# Patient Record
Sex: Male | Born: 1935 | Race: White | Hispanic: No | Marital: Married | Smoking: Former smoker
Health system: Southern US, Community
[De-identification: ages and names within clinical notes are randomized; demographics above are authoritative.]

## PROBLEM LIST (undated history)

## (undated) DIAGNOSIS — J8410 Pulmonary fibrosis, unspecified: Secondary | ICD-10-CM

## (undated) DIAGNOSIS — C801 Malignant (primary) neoplasm, unspecified: Secondary | ICD-10-CM

## (undated) DIAGNOSIS — C4490 Unspecified malignant neoplasm of skin, unspecified: Secondary | ICD-10-CM

## (undated) DIAGNOSIS — D499 Neoplasm of unspecified behavior of unspecified site: Secondary | ICD-10-CM

## (undated) DIAGNOSIS — I10 Essential (primary) hypertension: Secondary | ICD-10-CM

## (undated) DIAGNOSIS — H9113 Presbycusis, bilateral: Secondary | ICD-10-CM

## (undated) DIAGNOSIS — E119 Type 2 diabetes mellitus without complications: Secondary | ICD-10-CM

## (undated) DIAGNOSIS — M1990 Unspecified osteoarthritis, unspecified site: Secondary | ICD-10-CM

## (undated) HISTORY — DX: Essential (primary) hypertension: I10

## (undated) HISTORY — DX: Type 2 diabetes mellitus without complications: E11.9

## (undated) HISTORY — DX: Unspecified malignant neoplasm of skin, unspecified: C44.90

## (undated) HISTORY — DX: Pulmonary fibrosis, unspecified: J84.10

## (undated) HISTORY — DX: Unspecified osteoarthritis, unspecified site: M19.90

## (undated) HISTORY — DX: Malignant (primary) neoplasm, unspecified: C80.1

## (undated) HISTORY — PX: APPENDECTOMY: SHX54

## (undated) HISTORY — DX: Presbycusis, bilateral: H91.13

## (undated) HISTORY — PX: NECK SURGERY: SHX720

---

## 1984-06-25 HISTORY — PX: ROTATOR CUFF REPAIR: SHX139

## 2000-01-29 ENCOUNTER — Encounter: Admission: RE | Admit: 2000-01-29 | Discharge: 2000-01-29 | Payer: Self-pay

## 2000-03-25 ENCOUNTER — Encounter: Admission: RE | Admit: 2000-03-25 | Discharge: 2000-04-11 | Payer: Self-pay | Admitting: Orthopedic Surgery

## 2001-10-03 ENCOUNTER — Encounter (INDEPENDENT_AMBULATORY_CARE_PROVIDER_SITE_OTHER): Payer: Self-pay | Admitting: Specialist

## 2001-10-03 ENCOUNTER — Ambulatory Visit (HOSPITAL_COMMUNITY): Admission: RE | Admit: 2001-10-03 | Discharge: 2001-10-03 | Payer: Self-pay | Admitting: *Deleted

## 2002-12-25 ENCOUNTER — Ambulatory Visit (HOSPITAL_COMMUNITY): Admission: RE | Admit: 2002-12-25 | Discharge: 2002-12-25 | Payer: Self-pay | Admitting: *Deleted

## 2002-12-25 ENCOUNTER — Encounter (INDEPENDENT_AMBULATORY_CARE_PROVIDER_SITE_OTHER): Payer: Self-pay | Admitting: Specialist

## 2004-01-28 ENCOUNTER — Encounter: Admission: RE | Admit: 2004-01-28 | Discharge: 2004-01-28 | Payer: Self-pay | Admitting: Internal Medicine

## 2004-05-10 ENCOUNTER — Encounter (INDEPENDENT_AMBULATORY_CARE_PROVIDER_SITE_OTHER): Payer: Self-pay | Admitting: Specialist

## 2004-05-10 ENCOUNTER — Ambulatory Visit (HOSPITAL_COMMUNITY): Admission: RE | Admit: 2004-05-10 | Discharge: 2004-05-10 | Payer: Self-pay | Admitting: *Deleted

## 2006-07-31 ENCOUNTER — Encounter (INDEPENDENT_AMBULATORY_CARE_PROVIDER_SITE_OTHER): Payer: Self-pay | Admitting: Specialist

## 2006-07-31 ENCOUNTER — Ambulatory Visit (HOSPITAL_COMMUNITY): Admission: RE | Admit: 2006-07-31 | Discharge: 2006-07-31 | Payer: Self-pay | Admitting: *Deleted

## 2008-12-20 ENCOUNTER — Encounter: Admission: RE | Admit: 2008-12-20 | Discharge: 2008-12-20 | Payer: Self-pay | Admitting: Endocrinology

## 2009-10-24 ENCOUNTER — Encounter: Payer: Self-pay | Admitting: Pulmonary Disease

## 2009-10-27 ENCOUNTER — Encounter: Payer: Self-pay | Admitting: Pulmonary Disease

## 2009-10-28 ENCOUNTER — Encounter: Admission: RE | Admit: 2009-10-28 | Discharge: 2009-10-28 | Payer: Self-pay | Admitting: Internal Medicine

## 2009-10-28 ENCOUNTER — Encounter: Payer: Self-pay | Admitting: Pulmonary Disease

## 2009-11-08 ENCOUNTER — Ambulatory Visit: Payer: Self-pay | Admitting: Pulmonary Disease

## 2009-11-08 DIAGNOSIS — I10 Essential (primary) hypertension: Secondary | ICD-10-CM | POA: Insufficient documentation

## 2009-11-08 DIAGNOSIS — J8410 Pulmonary fibrosis, unspecified: Secondary | ICD-10-CM

## 2009-11-08 LAB — CONVERTED CEMR LAB
Angiotensin 1 Converting Enzyme: 32 units/L (ref 9–67)
Anti Nuclear Antibody(ANA): NEGATIVE

## 2009-11-09 LAB — CONVERTED CEMR LAB
ALT: 27 units/L (ref 0–53)
AST: 31 units/L (ref 0–37)
Albumin: 4.3 g/dL (ref 3.5–5.2)
Alkaline Phosphatase: 73 units/L (ref 39–117)
BUN: 17 mg/dL (ref 6–23)
Basophils Absolute: 0.1 10*3/uL (ref 0.0–0.1)
Basophils Relative: 0.7 % (ref 0.0–3.0)
Bilirubin, Direct: 0.1 mg/dL (ref 0.0–0.3)
CO2: 32 meq/L (ref 19–32)
Calcium: 10 mg/dL (ref 8.4–10.5)
Chloride: 99 meq/L (ref 96–112)
Creatinine, Ser: 1 mg/dL (ref 0.4–1.5)
Eosinophils Absolute: 0.3 10*3/uL (ref 0.0–0.7)
Eosinophils Relative: 3.2 % (ref 0.0–5.0)
GFR calc non Af Amer: 76.96 mL/min (ref >60)
Glucose, Bld: 128 mg/dL — ABNORMAL HIGH (ref 70–99)
HCT: 45.8 % (ref 39.0–52.0)
Hemoglobin: 15.9 g/dL (ref 13.0–17.0)
Lymphocytes Relative: 32.9 % (ref 12.0–46.0)
Lymphs Abs: 2.8 10*3/uL (ref 0.7–4.0)
MCHC: 34.6 g/dL (ref 30.0–36.0)
MCV: 92.3 fL (ref 78.0–100.0)
Monocytes Absolute: 0.8 10*3/uL (ref 0.1–1.0)
Monocytes Relative: 9.6 % (ref 3.0–12.0)
Neutro Abs: 4.6 10*3/uL (ref 1.4–7.7)
Neutrophils Relative %: 53.6 % (ref 43.0–77.0)
Platelets: 234 10*3/uL (ref 150.0–400.0)
Potassium: 4.4 meq/L (ref 3.5–5.1)
RBC: 4.97 M/uL (ref 4.22–5.81)
RDW: 12.6 % (ref 11.5–14.6)
Rheumatoid fact SerPl-aCnc: 22.7 intl units/mL — ABNORMAL HIGH (ref 0.0–20.0)
Sodium: 140 meq/L (ref 135–145)
Total Bilirubin: 1 mg/dL (ref 0.3–1.2)
Total Protein: 7.3 g/dL (ref 6.0–8.3)
WBC: 8.5 10*3/uL (ref 4.5–10.5)

## 2009-11-10 ENCOUNTER — Ambulatory Visit: Payer: Self-pay | Admitting: Internal Medicine

## 2009-12-01 ENCOUNTER — Ambulatory Visit: Payer: Self-pay | Admitting: Pulmonary Disease

## 2009-12-01 ENCOUNTER — Encounter: Payer: Self-pay | Admitting: Pulmonary Disease

## 2009-12-01 DIAGNOSIS — R059 Cough, unspecified: Secondary | ICD-10-CM | POA: Insufficient documentation

## 2009-12-01 DIAGNOSIS — R05 Cough: Secondary | ICD-10-CM | POA: Insufficient documentation

## 2010-03-20 DIAGNOSIS — K219 Gastro-esophageal reflux disease without esophagitis: Secondary | ICD-10-CM

## 2010-04-04 ENCOUNTER — Ambulatory Visit: Payer: Self-pay | Admitting: Pulmonary Disease

## 2010-07-27 NOTE — Miscellaneous (Signed)
Summary: Orders Update pft charges  Clinical Lists Changes  Orders: Added new Service order of Carbon Monoxide diffusing w/capacity (94720) - Signed Added new Service order of Lung Volumes (94240) - Signed Added new Service order of Spirometry (Pre & Post) (94060) - Signed 

## 2010-07-27 NOTE — Assessment & Plan Note (Signed)
Summary: f/u ///kp   Visit Type:  Follow-up Primary Provider/Referring Provider:  Dr. Merri Brunette  CC:  Pulmonary fibrosis follow-up...the patient has no breathing complaints today.  History of Present Illness: 77 yo male with pulmonary fibrosis.  He has no complaints.  He is not having cough, wheeze, sputum, chest pain, fever, hemoptysis, gland swelling, rash, or leg swelling.    He has been getting ringing in his ears, but was told there is nothing he can do for this.  He is sleeping okay, and has not noticed any problem with his breathing while asleep.  He has been active in his garden, and does not feel like his breathing limits his activity in any way.  He got his flu shot earlier this month.  Current Medications (verified): 1)  Atenolol-Chlorthalidone 50-25 Mg Tabs (Atenolol-Chlorthalidone) .Marland Kitchen.. 1 By Mouth Daily 2)  Aspirin 81 Mg Tabs (Aspirin) .Marland Kitchen.. 1 By Mouth Daily 3)  Fish Oil 1000 Mg Caps (Omega-3 Fatty Acids) .Marland Kitchen.. 1 By Mouth At Bedtime 4)  Prilosec 20 Mg Cpdr (Omeprazole) .Marland Kitchen.. 1 By Mouth Daily 5)  Cinnamon 500 Mg Tabs (Cinnamon) .... 2 By Mouth Daily 6)  Centrum Silver  Tabs (Multiple Vitamins-Minerals) .Marland Kitchen.. 1 By Mouth Daily 7)  Allergy Relief 4 Mg Tabs (Chlorpheniramine Maleate) .Marland Kitchen.. 1 By Mouth Daily As Needed  Allergies (verified): 1)  ! * Keflex 2)  ! * Vioxx 3)  ! * Codeine 4)  ! * Mycins  Past History:  Past Medical History: Hypertension Hyperlipidemia GERD Right renal angiomyolipoma Pulmonary fibrosis      - PFT 12/01/09 FEV1 2.64(114%), FEV1% 84, TLC 5.22(93%), DLCO 89%, no BD Chronic cough       - likely postnasal drip and reflux  Past Surgical History: Reviewed history from 11/08/2009 and no changes required. Appendectomy Right rotator cuff Bone spur right foot  Vital Signs:  Patient profile:   77 year old male Height:      67 inches (170.18 cm) Weight:      161 pounds (73.18 kg) BMI:     25.31 O2 Sat:      99 % on Room air Temp:     97.8  degrees F (36.56 degrees C) oral Pulse rate:   53 / minute BP sitting:   132 / 82  (left arm) Cuff size:   regular  Vitals Entered By: Michel Bickers CMA (April 04, 2010 1:46 PM)  O2 Sat at Rest %:  99 O2 Flow:  Room air CC: Pulmonary fibrosis follow-up...the patient has no breathing complaints today Comments Medications reviewed with patient Michel Bickers [facility]  April 04, 2010 1:54 PM   Physical Exam  General:  normal appearance and healthy appearing.   Nose:  no deformity, discharge, inflammation, or lesions Mouth:  no deformity or lesions Neck:  no JVD.   Lungs:  faint basilar rales, no wheeze Heart:  regular rate and rhythm, S1, S2 without murmurs, rubs, gallops, or clicks Extremities:  no clubbing, cyanosis, edema, or deformity noted Neurologic:  normal CN II-XII and strength normal.   Cervical Nodes:  no significant adenopathy Psych:  alert and cooperative; normal mood and affect; normal attention span and concentration   Impression & Recommendations:  Problem # 1:  PULMONARY FIBROSIS (ICD-515) Stable.  Will monitor clinically.  Will consider repeat pulmonary function testing and/or chest imaging if his symptoms progress.  Problem # 2:  COUGH (ICD-786.2)  Improved.  He is to continue as needed nasal irrigation.  Problem # 3:  GERD (ICD-530.81)  He was previously seen by Dr. Virginia Rochester, and was told he had Barrett's.  He was recently evaluated by Dr. Elnoria Howard, and was informed that he may never have had Barrett's  Complete Medication List: 1)  Atenolol-chlorthalidone 50-25 Mg Tabs (Atenolol-chlorthalidone) .Marland Kitchen.. 1 by mouth daily 2)  Aspirin 81 Mg Tabs (Aspirin) .Marland Kitchen.. 1 by mouth daily 3)  Fish Oil 1000 Mg Caps (Omega-3 fatty acids) .Marland Kitchen.. 1 by mouth at bedtime 4)  Prilosec 20 Mg Cpdr (Omeprazole) .Marland Kitchen.. 1 by mouth daily 5)  Cinnamon 500 Mg Tabs (Cinnamon) .... 2 by mouth daily 6)  Centrum Silver Tabs (Multiple vitamins-minerals) .Marland Kitchen.. 1 by mouth daily 7)  Allergy Relief 4 Mg Tabs  (Chlorpheniramine maleate) .Marland Kitchen.. 1 by mouth daily as needed  Other Orders: Est. Patient Level III (16109)  Patient Instructions: 1)  Follow up in 6 months   Immunization History:  Influenza Immunization History:    Influenza:  historical (03/30/2010)

## 2010-07-27 NOTE — Assessment & Plan Note (Signed)
Summary: f/u after PFT/LC   Visit Type:  Follow-up Primary Provider/Referring Provider:  Dr. Merri Brunette  CC:  Pulmonary fibrosis follow-up.  The patient has no complaints today.Marland Kitchen  History of Present Illness: 77 yo male with pulmonary fibrosis.  He has occasional dry cough.  He has some reflux and postnasal drip.  He gets a globus sensation.  He is not having wheezing.  He has noticed that his reflux is worse after taking fish oil.  Labs from May 17 were reviewed, and unremarkable.  Pulmonary function test 12/01/09: Normal spirometry, no bronchodilator response, normal lung volumes, normal diffusion.  CT of Chest  Procedure date:  11/10/2009  Findings:      Comparison: None.   Findings: There are no pathologically enlarged lymph nodes.  Faint calcific density is noted in the left coronary artery.  Hiatal hernia.   The subpleural interstitial markings are rather diffusely accentuated but are mainly prominent in the lower lung zones/bases where there is a small cystic foci compatible with subpleural honeycombing.  No pulmonary mass lesions.  Mild traction bronchiectasis in the medial aspects of the lower lung zones. Main pulmonary trunk measures 2.9 cm in diameter which is at the upper limits of normal.   IMPRESSION: Pulmonary interstitial fibrosis.   Current Medications (verified): 1)  Atenolol-Chlorthalidone 50-25 Mg Tabs (Atenolol-Chlorthalidone) .Marland Kitchen.. 1 By Mouth Daily 2)  Aspirin 81 Mg Tabs (Aspirin) .Marland Kitchen.. 1 By Mouth Daily 3)  Fish Oil 1000 Mg Caps (Omega-3 Fatty Acids) .Marland Kitchen.. 1 By Mouth At Bedtime 4)  Prilosec 20 Mg Cpdr (Omeprazole) .Marland Kitchen.. 1 By Mouth Daily 5)  Cinnamon 500 Mg Tabs (Cinnamon) .... 2 By Mouth Daily 6)  Centrum Silver  Tabs (Multiple Vitamins-Minerals) .Marland Kitchen.. 1 By Mouth Daily 7)  Allergy Relief 4 Mg Tabs (Chlorpheniramine Maleate) .Marland Kitchen.. 1 By Mouth Daily As Needed  Allergies (verified): 1)  ! * Keflex 2)  ! * Vioxx 3)  ! * Codeine 4)  ! * Mycins  Past  History:  Past Medical History: Hypertension Hyperlipidemia Barrett's Esophagus Right renal angiomyolipoma Pulmonary fibrosis      - PFT 12/01/09 FEV1 2.64(114%), FEV1% 84, TLC 5.22(93%), DLCO 89%, no BD Chronic cough       - likely postnasal drip and reflux  Vital Signs:  Patient profile:   77 year old male Height:      67 inches (170.18 cm) Weight:      159 pounds (72.27 kg) BMI:     24.99 O2 Sat:      97 % on Room air Temp:     97.6 degrees F (36.44 degrees C) oral Pulse rate:   61 / minute BP sitting:   118 / 72  (right arm) Cuff size:   regular  Vitals Entered By: Michel Bickers CMA (December 01, 2009 1:31 PM)  O2 Sat at Rest %:  97 O2 Flow:  Room air CC: Pulmonary fibrosis follow-up.  The patient has no complaints today. Comments Medications reviewed. Daytime phone verified. Michel Bickers CMA  December 01, 2009 1:33 PM   Physical Exam  General:  normal appearance and healthy appearing.   Nose:  no deformity, discharge, inflammation, or lesions Mouth:  no deformity or lesions Neck:  no JVD.   Lungs:  faint basilar rales, no wheeze Heart:  regular rate and rhythm, S1, S2 without murmurs, rubs, gallops, or clicks Extremities:  no clubbing, cyanosis, edema, or deformity noted Cervical Nodes:  no significant adenopathy   Impression & Recommendations:  Problem #  1:  PULMONARY FIBROSIS (ICD-515) He has minimal symptoms, radiographic findings, and pulmonary function testing is normal.  Will monitor clinically.  Advised him that fibrosis can sometimes be progressive, and will need to monitor closely.  Problem # 2:  BARRETTS ESOPHAGUS (ICD-530.85)  He is to d/w Dr. Renne Crigler about getting a f/u with GI.  Orders: Est. Patient Level III (16109)  Problem # 3:  COUGH (ICD-786.2)  This is mild.  He is to try nasal irrigation for possible post-nasal drip.  He is to hold his fish oil as this could be contributing to his reflux.  Orders: Est. Patient Level III (60454)  Complete  Medication List: 1)  Atenolol-chlorthalidone 50-25 Mg Tabs (Atenolol-chlorthalidone) .Marland Kitchen.. 1 by mouth daily 2)  Aspirin 81 Mg Tabs (Aspirin) .Marland Kitchen.. 1 by mouth daily 3)  Fish Oil 1000 Mg Caps (Omega-3 fatty acids) .Marland Kitchen.. 1 by mouth at bedtime 4)  Prilosec 20 Mg Cpdr (Omeprazole) .Marland Kitchen.. 1 by mouth daily 5)  Cinnamon 500 Mg Tabs (Cinnamon) .... 2 by mouth daily 6)  Centrum Silver Tabs (Multiple vitamins-minerals) .Marland Kitchen.. 1 by mouth daily 7)  Allergy Relief 4 Mg Tabs (Chlorpheniramine maleate) .Marland Kitchen.. 1 by mouth daily as needed  Patient Instructions: 1)  Stop fish oil for one week to see if this improves cough 2)  Try nasal irrigation (saline sinus rinse) once daily as needed  3)  Talk to Dr. Renne Crigler about whether you need to see a gastroenterologist 4)  Follow up in 4 months

## 2010-07-27 NOTE — Assessment & Plan Note (Signed)
Summary: abnormal lung exam/apc   Visit Type:  Initial Consult Primary Provider/Referring Provider:  Dr. Merri Brunette  CC:  Pulmonary consult. Patient c/o a non-productive cough but says he has had this for 2 years. The patient brought recent CD of CXR.Rick [name]  History of Present Illness: 77 yo male with dyspnea, cough and abnormal xray.  Has noticed a cough and some shortness of breath for a while.  He does not bring up sputum or blood when he coughs.  He is not sure what brings his cough on.  He denies wheezing, or chest pains.  There is no prior history of asthma.  He can usually do most of his daily activities, but will get short winded if he exerts himself too much.  He does exercise 3 days per week at his gym.  He does notice some difficulty going up hills or stairs.  He quit smoking 50 years ago.  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  He has never had TB, but his father did.  There is no prior history of pneumonia.  He gets occasional allergies with the change of the seasons.  He denies travel history or animal exposure.  He has a history of Barrett's esophagus.  He denies difficulty with swallowing.  He denies skin rashes, gland swelling, or joint swelling.  Lab results from Oct 27, 2009 were reviewed.  CBC and U/A were normal.  Chest xray from Oct 24, 2009 was reviewed, and showed interstitial prominence with basilar fibrosis.  Preventive Screening-Counseling & Management  Alcohol-Tobacco     Alcohol drinks/day: 0     Smoking Status: quit     Year Quit: 1960     Pack years: ?10 years x 1ppd  Current Medications (verified): 1)  Atenolol-Chlorthalidone 50-25 Mg Tabs (Atenolol-Chlorthalidone) .Rick [name].. 1 By Mouth Daily 2)  Aspirin 81 Mg Tabs (Aspirin) .Rick [name].. 1 By Mouth Daily 3)  Cinnamon 500 Mg Tabs (Cinnamon) .... 2 By Mouth Daily 4)  Centrum Silver  Tabs (Multiple Vitamins-Minerals) .Rick [name].. 1 By Mouth Daily 5)  Fish Oil 1000 Mg Caps (Omega-3 Fatty Acids) .Rick [name].. 1 By Mouth At Bedtime 6)   Prilosec 20 Mg Cpdr (Omeprazole) .Rick [name].. 1 By Mouth Daily 7)  Allergy Relief 4 Mg Tabs (Chlorpheniramine Maleate) .Rick [name].. 1 By Mouth Daily As Needed  Allergies (verified): 1)  ! * Keflex 2)  ! * Vioxx 3)  ! * Codeine 4)  ! * Mycins  Past History:  Past Medical History: Hypertension Hyperlipidemia Barrett's Esophagus Right renal angiomyolipoma  Past Surgical History: Appendectomy Right rotator cuff Bone spur right foot  Family History: No significant family history  Social History: Smoked 1 pack per day for 10 years, and quit 1960. 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  The patient denies shortness of breath with activity, shortness of breath at rest, productive cough, coughing up blood, chest pain, irregular heartbeats, acid heartburn, indigestion, loss of appetite, weight change, abdominal pain, difficulty swallowing, sore throat, tooth/dental problems, headaches, nasal congestion/difficulty breathing through nose, sneezing, itching, ear ache, anxiety, depression, hand/feet swelling, joint stiffness or pain, rash, change in color of mucus, and fever.    Vital Signs:  Patient profile:   77 year old male Height:      67 inches (170.18 cm) Weight:  156 pounds (70.91 kg) BMI:     24.52 O2 Sat:      96 % on Room air Temp:     97.8 degrees F (36.56 degrees C) oral Pulse rate:   58 / minute BP sitting:   114 / 76  (right arm) Cuff size:   regular  Vitals Entered By: Michel Bickers CMA (Nov 08, 2009 10:18 AM)  O2 Sat at Rest %:  96 O2 Flow:  Room air CC: Pulmonary consult. Patient c/o a non-productive cough but says he has had this for 2 years. The patient brought recent CD of CXR. Is Patient Diabetic? No Comments Medications reviewed. Michel Bickers CMA  Nov 08, 2009 10:19  AM   Physical Exam  General:  normal appearance and healthy appearing.   Eyes:  PERRLA and EOMI.   Nose:  no deformity, discharge, inflammation, or lesions Mouth:  no deformity or lesions Neck:  no JVD.   Chest Wall:  no deformities noted Lungs:  faint basilar rales, no wheeze Heart:  regular rate and rhythm, S1, S2 without murmurs, rubs, gallops, or clicks Abdomen:  bowel sounds positive; abdomen soft and non-tender without masses, or organomegaly Msk:  no deformity or scoliosis noted with normal posture Pulses:  pulses normal Extremities:  no clubbing, cyanosis, edema, or deformity noted Neurologic:  CN II-XII grossly intact with normal reflexes, coordination, muscle strength and tone Cervical Nodes:  no significant adenopathy Axillary Nodes:  no significant adenopathy Psych:  alert and cooperative; normal mood and affect; normal attention span and concentration   Impression & Recommendations:  Problem # 1:  PULMONARY FIBROSIS (ICD-515) He has persistent dry cough, and mild symptoms of dyspnea.  He has minimal smoking history, and his symptoms are not suggestive of obstructive lung disease.  He has fibrotic changes on chest xray.  He does have a history of reflux.  The main concern is that he could have early symptoms of pulmonary fibrosis.  He does not have symptoms to suggest connective tissue disease.  To further evaluate this I will arrange for pulmonary function testing, lab testing, and high resolution CT chest.  Depending on these findings further recommendations will be made.  Given his history of reflux he may also need a swallowing evaluation to exclude silent aspiration as a cause of his fibrosis and cough.  Medications Added to Medication List This Visit: 1)  Allergy Relief 4 Mg Tabs (Chlorpheniramine maleate) .Rick [name].. 1 by mouth daily as needed  Complete Medication List: 1)  Atenolol-chlorthalidone 50-25 Mg Tabs (Atenolol-chlorthalidone) .Rick [name].. 1 by mouth daily 2)  Aspirin  81 Mg Tabs (Aspirin) .Rick [name].. 1 by mouth daily 3)  Fish Oil 1000 Mg Caps (Omega-3 fatty acids) .Rick [name].. 1 by mouth at bedtime 4)  Prilosec 20 Mg Cpdr (Omeprazole) .Rick [name].. 1 by mouth daily 5)  Cinnamon 500 Mg Tabs (Cinnamon) .... 2 by mouth daily 6)  Centrum Silver Tabs (Multiple vitamins-minerals) .Rick [name].. 1 by mouth daily 7)  Allergy Relief 4 Mg Tabs (Chlorpheniramine maleate) .Rick [name].. 1 by mouth daily as needed  Other Orders: Consultation Level IV (16109) Full Pulmonary Function Test (PFT) TLB-BMP (Basic Metabolic Panel-BMET) (80048-METABOL) TLB-CBC Platelet - w/Differential (85025-CBCD) TLB-Hepatic/Liver Function Pnl (80076-HEPATIC) T-Angiotensin i-Converting Enzyme (60454-09811) T-Antinuclear Antib (ANA) (91478-29562) TLB-Rheumatoid Factor (RA) (13086-VH) Radiology Referral (Radiology)  Patient Instructions: 1)  Blood test today 2)  Will schedule breathing test (PFT) 3)  Will schedule CT chest 4)  Follow up in 3 to 4 weeks   Immunization History:  Tetanus/Td Immunization History:  Tetanus/Td:  historical (09/10/2008)  Influenza Immunization History:    Influenza:  historical (03/25/2009)  Pneumovax Immunization History:    Pneumovax:  historical (09/10/2008)

## 2010-07-27 NOTE — Miscellaneous (Signed)
Summary: Pulmonary function test   Pulmonary Function Test Date: 11/24/2009 Height (in.): 67 Gender: Male  Pre-Spirometry FVC    Value: 3.19 L/min   Pred: 3.65 L/min     % Pred: 87 % FEV1    Value: 2.57 L     Pred: 2.31 L     % Pred: 111 % FEV1/FVC  Value: 81 %     Pred: 67 %     % Pred: . % FEF 25-75  Value: 2.93 L/min   Pred: 2.02 L/min     % Pred: 145 %  Post-Spirometry FVC    Value: 3.16 L/min   Pred: 3.65 L/min     % Pred: 86 % FEV1    Value: 2.64 L     Pred: 2.31 L     % Pred: 114 % FEV1/FVC  Value: 84 %     Pred: 67 %     % Pred: . % FEF 25-75  Value: 3.48 L/min   Pred: 2.02 L/min     % Pred: 172 %  Lung Volumes TLC    Value: 5.22 L   % Pred: 93 % RV    Value: 2.03 L   % Pred: 81 % DLCO    Value: 14.2 %   % Pred: 89 % DLCO/VA  Value: 3.42 %   % Pred: 106 %  Comments: Normal spirometry.  No bronchodilator response.  Normal lung volumes.  Normal diffusion capacity. Clinical Lists Changes  Observations: Added new observation of PFT COMMENTS: Normal spirometry.  No bronchodilator response.  Normal lung volumes.  Normal diffusion capacity. (12/01/2009 15:08) Added new observation of DLCO/VA%EXP: 106 % (12/01/2009 15:08) Added new observation of DLCO/VA: 3.42 % (12/01/2009 15:08) Added new observation of DLCO % EXPEC: 89 % (12/01/2009 15:08) Added new observation of DLCO: 14.2 % (12/01/2009 15:08) Added new observation of RV % EXPECT: 81 % (12/01/2009 15:08) Added new observation of RV: 2.03 L (12/01/2009 15:08) Added new observation of TLC % EXPECT: 93 % (12/01/2009 15:08) Added new observation of TLC: 5.22 L (12/01/2009 15:08) Added new observation of FEF2575%EXPS: 172 % (12/01/2009 15:08) Added new observation of PSTFEF25/75P: 2.02  (12/01/2009 15:08) Added new observation of PSTFEF25/75%: 3.48 L/min (12/01/2009 15:08) Added new observation of PSTFEV1/FCV%: . % (12/01/2009 15:08) Added new observation of FEV1FVCPRDPS: 67 % (12/01/2009 15:08) Added new observation of  PSTFEV1/FVC: 84 % (12/01/2009 15:08) Added new observation of POSTFEV1%PRD: 114 % (12/01/2009 15:08) Added new observation of FEV1PRDPST: 2.31 L (12/01/2009 15:08) Added new observation of POST FEV1: 2.64 L/min (12/01/2009 15:08) Added new observation of POST FVC%EXP: 86 % (12/01/2009 15:08) Added new observation of FVCPRDPST: 3.65 L/min (12/01/2009 15:08) Added new observation of POST FVC: 3.16 L (12/01/2009 15:08) Added new observation of FEF % EXPEC: 145 % (12/01/2009 15:08) Added new observation of FEF25-75%PRE: 2.02 L/min (12/01/2009 15:08) Added new observation of FEF 25-75%: 2.93 L/min (12/01/2009 15:08) Added new observation of FEV1/FVC%EXP: . % (12/01/2009 15:08) Added new observation of FEV1/FVC PRE: 67 % (12/01/2009 15:08) Added new observation of FEV1/FVC: 81 % (12/01/2009 15:08) Added new observation of FEV1 % EXP: 111 % (12/01/2009 15:08) Added new observation of FEV1 PREDICT: 2.31 L (12/01/2009 15:08) Added new observation of FEV1: 2.57 L (12/01/2009 15:08) Added new observation of FVC % EXPECT: 87 % (12/01/2009 15:08) Added new observation of FVC PREDICT: 3.65 L (12/01/2009 15:08) Added new observation of FVC: 3.19 L (12/01/2009 15:08) Added new observation of PFT HEIGHT: 67  (12/01/2009 15:08) Added new observation of  PFT DATE: 11/24/2009  (12/01/2009 15:08)

## 2010-10-30 ENCOUNTER — Other Ambulatory Visit: Payer: Self-pay | Admitting: Internal Medicine

## 2010-10-30 DIAGNOSIS — D1771 Benign lipomatous neoplasm of kidney: Secondary | ICD-10-CM

## 2010-11-10 NOTE — Op Note (Signed)
   NAME:  Rick [name], Rick [name]                     ACCOUNT NO.:  [id number]   MEDICAL RECORD NO.:  [id number]                   PATIENT TYPE:  AMB   LOCATION:  ENDO                                 FACILITY:  [facility]   PHYSICIAN:  Georgiana Spinner, M.D.                 DATE OF BIRTH:  09-Mar-1926   DATE OF PROCEDURE:  DATE OF DISCHARGE:                                 OPERATIVE REPORT   PROCEDURE:  Upper endoscopy with biopsy.   ANESTHESIA:  Demerol 50, Versed 5 mg.   INDICATIONS FOR PROCEDURE:  Gastroesophageal reflux disease.   DESCRIPTION OF PROCEDURE:  With the patient mildly sedated in the left  lateral decubitus position, the Olympus videoscopic endoscope was inserted  in the mouth and passed under direct vision through the esophagus which  appeared normal until we reached the distal esophagus and there was a  questionable area or two of Barrett's which were photographed and biopsied.  We entered into the stomach via hiatal hernia. The fundus, body, antrum,  duodenal bulb and second portion of the duodenum all appeared normal. From  this point, the endoscope was slowly withdrawn taking circumferential views  of the duodenal mucosa until the endoscope was then pulled back in the  stomach, placed in retroflexion to view the stomach from below and a hiatal  hernia was once again seen. The endoscope was straightened and withdrawn  taking circumferential views of the remaining gastric and esophageal mucosa.  The patient's vital signs and pulse oximeter remained stable. The patient  tolerated the procedure well without apparent complications.   FINDINGS:  Question of Barrett's esophagus above a hiatal hernia. Await  biopsy report. The patient will call me for results and followup with me as  an outpatient.                                               Georgiana Spinner, M.D.    GMO/MEDQ  D:  12/25/2002  T:  12/25/2002  Job:  [id number]

## 2010-11-10 NOTE — Procedures (Signed)
[facility]  Patient:    Rick [name], Rick [name] Visit Number: [id number] MRN: [id number]          Service Type: END Location: ENDO Attending Physician:  Sabino Gasser Dictated by:   Sabino Gasser, M.D. Proc. Date: 10/03/01 Admit Date:  10/03/2001                             Procedure Report  PROCEDURE:  Upper endoscopy.  INDICATIONS FOR PROCEDURE:  Gastroesophageal reflux disease.  ANESTHESIA:  Demerol 60, Versed 6 mg.  DESCRIPTION OF PROCEDURE:  With the patient mildly sedated in the left lateral decubitus position, the Olympus videoscopic endoscope was inserted in the mouth and passed under direct vision through the esophagus which appeared normal until we reached the distal esophagus and there were areas of Barretts esophagus seen, photographed and biopsied above a hiatal hernia. We entered into the stomach through the hiatal hernia. The fundus, body, antrum, duodenal bulb and second portion of the duodenum were visualized. From this point, the endoscope was slowly withdrawn taking circumferential views of the entire duodenal mucosa until the endoscope was then pulled into the stomach and placed in retroflexion to view the stomach from below. The endoscope was then straightened and withdrawn taking circumferential views of the remaining gastric and esophageal mucosa stopping in the stomach where erythema was seen, photographed, and biopsied. The endoscope was withdrawn. The patients vital signs and pulse oximeter remained stable. The patient tolerated the procedure well without apparent complications.  FINDINGS:  Changes of erythema in the stomach, hiatal hernia with what is felt to be possibly Barretts esophagus biopsied.  PLAN:  Await biopsy report. The patient will call me for results and followup with me as an outpatient. Proceed to colonoscopy as planned. Dictated by:   Sabino Gasser, M.D. Attending Physician:  Sabino Gasser DD:  10/03/01 TD:   10/04/01 Job: [id number]

## 2010-11-10 NOTE — Op Note (Signed)
NAMEDEMETRI, GOSHERT           ACCOUNT NO.:  [id number]   MEDICAL RECORD NO.:  [id number]          PATIENT TYPE:  AMB   LOCATION:  ENDO                         FACILITY:  [facility]   PHYSICIAN:  Georgiana Spinner, M.D.    DATE OF BIRTH:  07-31-1925   DATE OF PROCEDURE:  07/31/2006  DATE OF DISCHARGE:                               OPERATIVE REPORT   PROCEDURE:  Upper endoscopy with biopsy.   INDICATIONS:  GERD.   ANESTHESIA:  Fentanyl 60 mcg, Versed 6 mg.   PROCEDURE:  With the patient mildly sedated in the left lateral  decubitus position the Pentax videoscopic endoscope was inserted in the  mouth and passed under direct vision through the esophagus which  appeared normal on direct view.  There was a hiatal hernia that we  entered into and passed distally into the stomach. Fundus, body, antrum,  duodenal bulb, second portion duodenum were visualized. From this point  the endoscope was slowly withdrawn taking circumferential views of  duodenal mucosa until the endoscope had been pulled back into stomach,  placed in retroflexion to view the stomach from below. In retroflexed  view we found areas in the squamocolumnar junction that we biopsied to  rule out Barrett's esophagus.  Once accomplished, the endoscope was then  straightened and withdrawn taking circumferential views of remaining  gastric and esophageal mucosa.  We explored the squamocolumnar junction  again and also did some biopsies in direct view. The endoscope was  withdrawn.  The patient's vital signs, pulse oximeter remained stable.  The patient tolerated procedure well without apparent complications.   FINDINGS:  Hiatal hernia with question of Barrett's esophagus biopsied.  Await biopsy report.  The patient will call me for results and follow-up  with me as an outpatient.           ______________________________  Georgiana Spinner, M.D.     GMO/MEDQ  D:  07/31/2006  T:  07/31/2006  Job:  [id number]

## 2010-11-10 NOTE — Op Note (Signed)
NAMEKARINA, LENDERMAN           ACCOUNT NO.:  [id number]   MEDICAL RECORD NO.:  [id number]          PATIENT TYPE:  AMB   LOCATION:  ENDO                         FACILITY:  [facility]   PHYSICIAN:  Georgiana Spinner, M.D.    DATE OF BIRTH:  12-09-1925   DATE OF PROCEDURE:  05/10/2004  DATE OF DISCHARGE:                                 OPERATIVE REPORT   PROCEDURE:  Upper endoscopy with biopsy.   INDICATIONS:  GERD.   ANESTHESIA:  1.  Demerol 50 mg.  2.  Versed 5 mg.   DESCRIPTION OF PROCEDURE:  With patient mildly sedated in the left lateral  decubitus position, the Olympus videoscopic endoscope was inserted in the  mouth, passed under direct vision through the esophagus, which appeared  normal until we reached the distal esophagus, and there was a hiatal hernia.  We then entered into the stomach.  Fundus, body, antrum, duodenal bulb,  second portion of duodenum were visualized.  From this point, the endoscope  was slowly withdrawn, taking circumferential views of the duodenal mucosa  until the endoscope had been pulled back into the stomach, placed in  retroflexion to view the stomach from below, and there was a question of  Barrett's seen on retroflex view; only I could not see this on direct view,  so the endoscope was then placed in a position where we could biopsy this  area, which we did.  The endoscope was then straightened, withdrawn, taking  circumferential views of the remaining gastric and esophageal mucosa.  The  patient's vital signs and pulse oximeter remained stable.  The patient  tolerated the procedure well without apparent complications.   FINDINGS:  Question of Barrett's esophagus above a hiatal hernia.  Await  biopsy report.  The patient will call me for results and follow up with me  as an outpatient.      GMO/MEDQ  D:  05/10/2004  T:  05/10/2004  Job:  [id number]

## 2010-11-10 NOTE — Procedures (Signed)
[facility]  Patient:    Rick [name], Rick [name] Visit Number: [id number] MRN: [id number]          Service Type: END Location: ENDO Attending Physician:  Sabino Gasser Dictated by:   Sabino Gasser, M.D. Proc. Date: 10/03/01 Admit Date:  10/03/2001                             Procedure Report  PROCEDURE:  Colonoscopy.  INDICATIONS FOR PROCEDURE:  Colon cancer screening.  ANESTHESIA:  Demerol 10, Versed 1 mg additionally.  DESCRIPTION OF PROCEDURE:  With the patient mildly sedated in the left lateral decubitus position, the Olympus videoscopic colonoscope was inserted in the rectum and passed under direct vision to the cecum identified by the ileocecal valve and appendiceal orifice. From this point, the colonoscope was slowly withdrawn taking circumferential views of the entire colonic mucosa stopping in the rectum which appeared normal in direct and showed hemorrhoids on retroflexed view. The endoscope was straightened and withdrawn. The patients vital signs and pulse oximeter remained stable. The patient tolerated the procedure well without apparent complications.  FINDINGS:  Internal hemorrhoids otherwise unremarkable exam.  PLAN:  See endoscopy note for further details. Dictated by:   Sabino Gasser, M.D. Attending Physician:  Sabino Gasser DD:  10/03/01 TD:  10/04/01 Job: [id number]

## 2010-11-21 ENCOUNTER — Ambulatory Visit
Admission: RE | Admit: 2010-11-21 | Discharge: 2010-11-21 | Disposition: A | Discharge status: Home / Self Care | Payer: MEDICARE | Source: Ambulatory Visit | Attending: Internal Medicine | Admitting: Internal Medicine

## 2010-11-21 DIAGNOSIS — D1771 Benign lipomatous neoplasm of kidney: Secondary | ICD-10-CM

## 2012-05-18 IMAGING — CT CT CHEST W/ CM
2 of 4 series · 15 of 36 positions shown, 18 images · IV contrast (Omnipaque 300)
Comparison: None.

Addendum Begins

Comparison was made to the outside chest x-ray 10/24/2009.
Interpretation remains the same.  No significant change since the
recent chest x-ray.
Addendum Ends
CLINICAL DATA: Pulmonary fibrosis.
CT CHEST WITH CONTRAST with high resolution cuts
TECHNIQUE: Multidetector CT imaging of the chest was performed
following the standard protocol during bolus administration of
intravenous contrast.
Contrast: 80 ml Bmnipaque-XTT IV.

[Series 2: chest routine with · axial · 0.75mm/px · z∈[-351,-56]mm · 12 of 71 slices shown, 15 images]
[im 6/71  mediastinal]
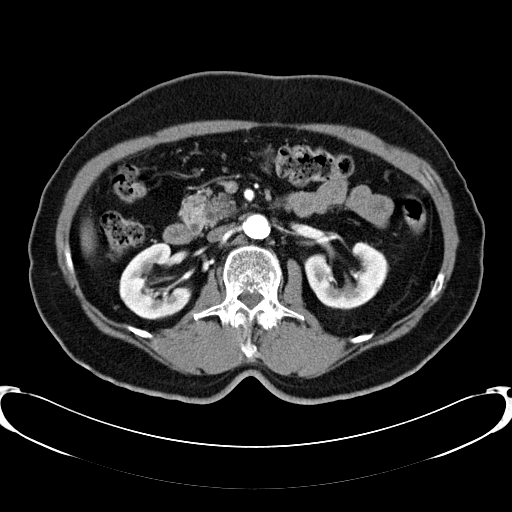
[im 6/71  lung]
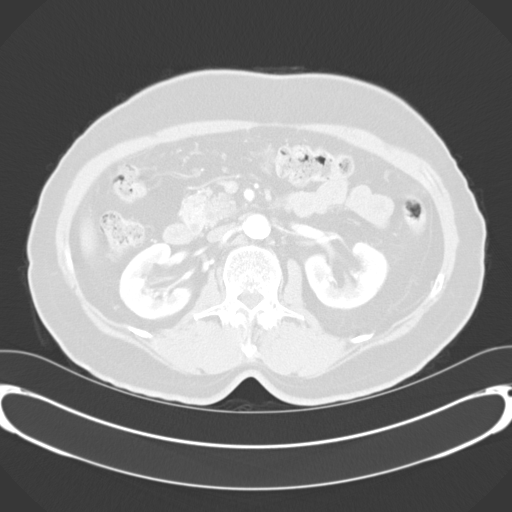
[im 11/71  lung]
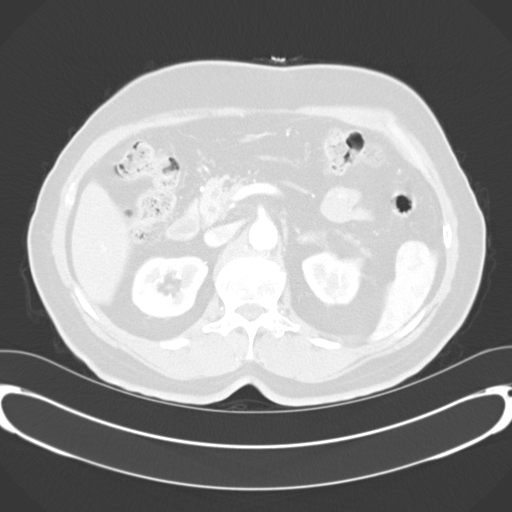
[im 17/71  lung]
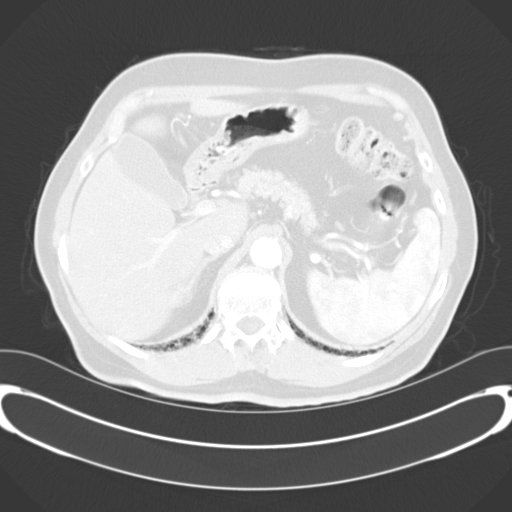
[im 22/71  lung]
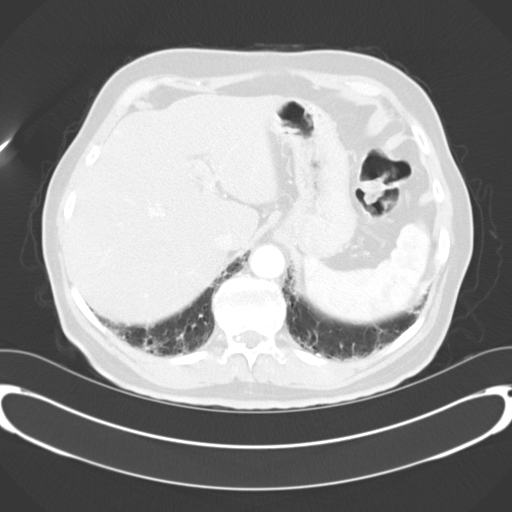
[im 27/71  mediastinal]
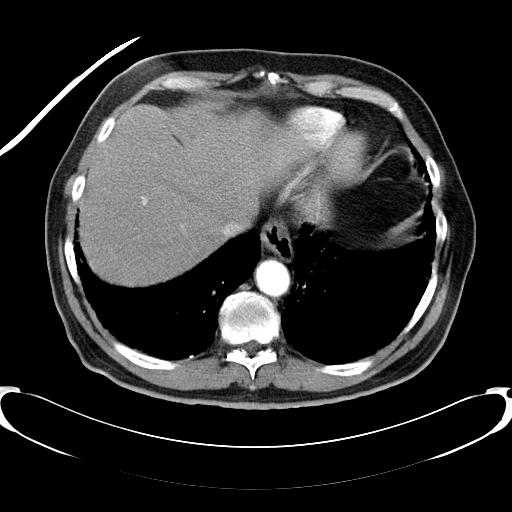
[im 27/71  lung]
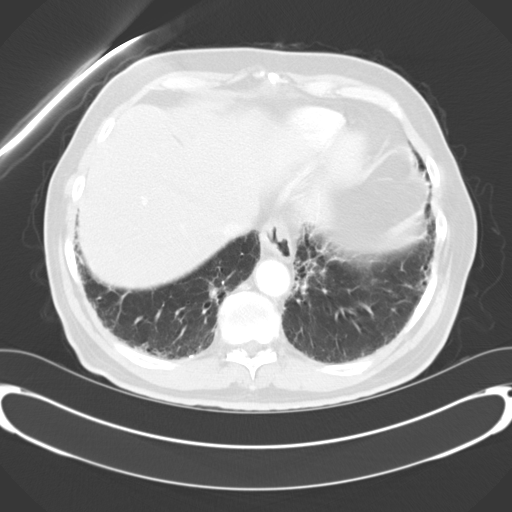
[im 33/71  lung]
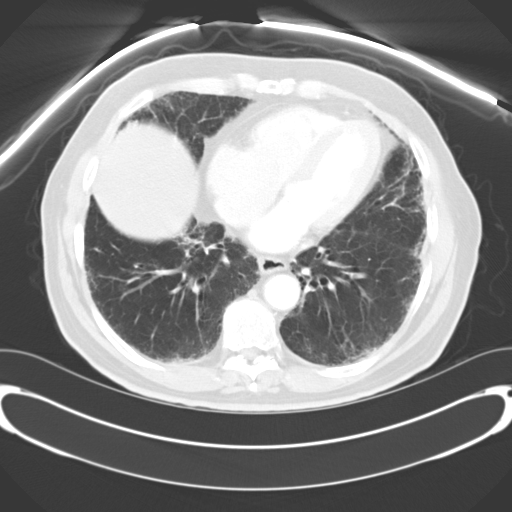
[im 38/71  lung]
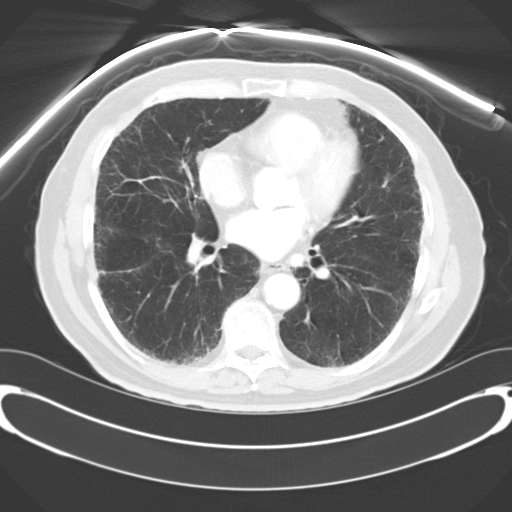
[im 44/71  lung]
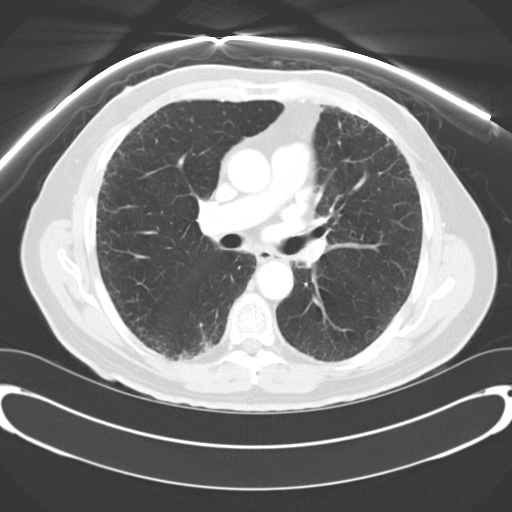
[im 49/71  mediastinal]
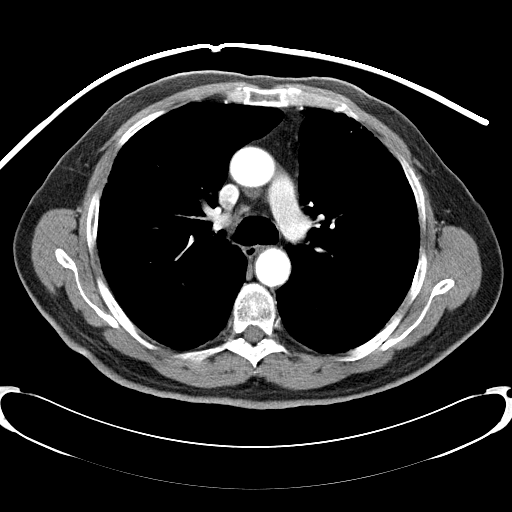
[im 49/71  lung]
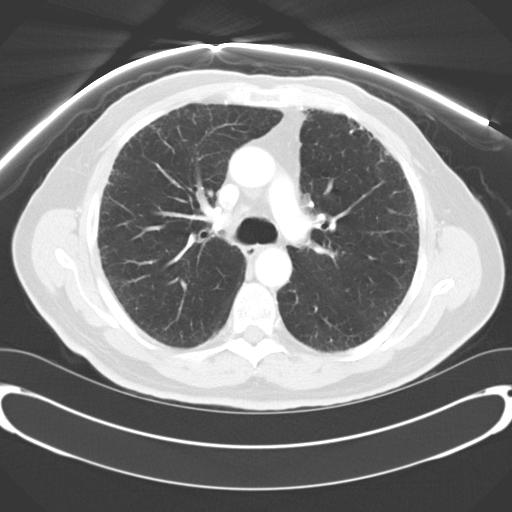
[im 54/71  lung]
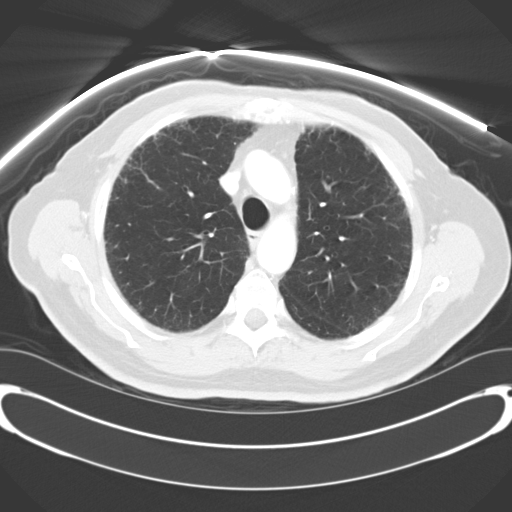
[im 60/71  lung]
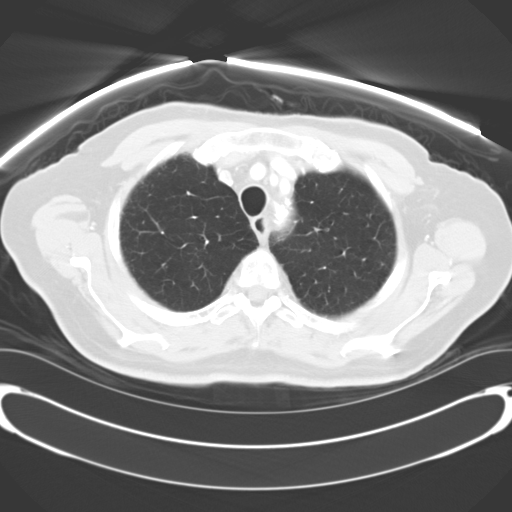
[im 65/71  lung]
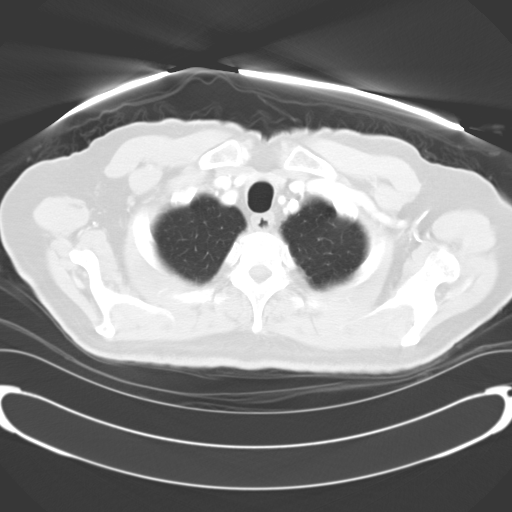

[Series 602: <mpr range> · coronal · 0.75mm/px · 3 of 137 slices shown]
[im 28/137  lung]
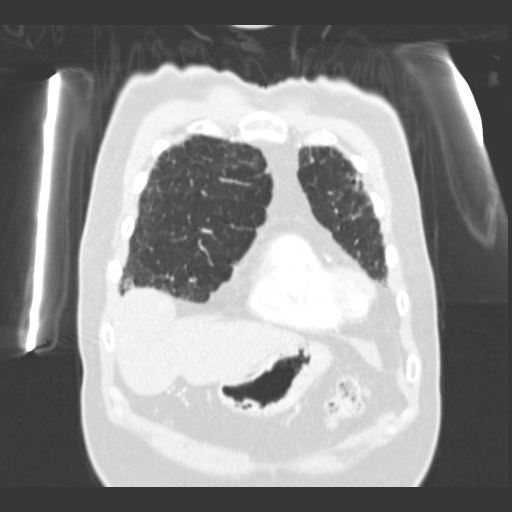
[im 55/137  lung]
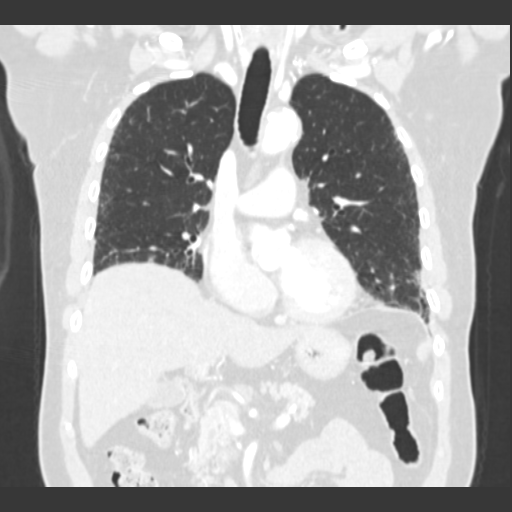
[im 82/137  lung]
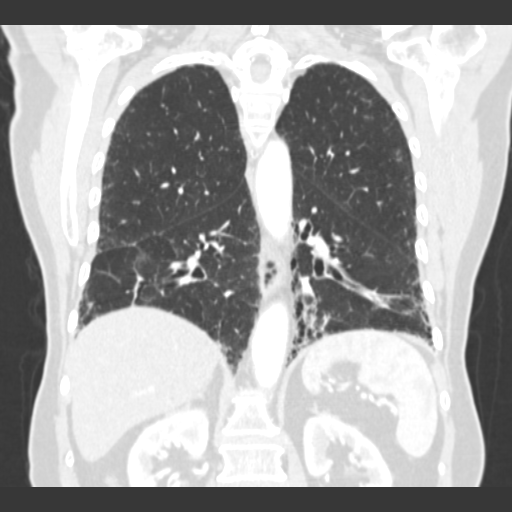

[15 of 36 positions shown; findings below may reference images not displayed]

FINDINGS: There are no pathologically enlarged lymph nodes.  Faint
calcific density is noted in the left coronary artery.  Hiatal
hernia.

The subpleural interstitial markings are rather diffusely
accentuated but are mainly prominent in the lower lung zones/bases
where there is a small cystic foci compatible with subpleural
honeycombing.  No pulmonary mass lesions.  Mild traction
bronchiectasis in the medial aspects of the lower lung zones. Main
pulmonary trunk measures 2.9 cm in diameter which is at the upper
limits of normal.
IMPRESSION: Pulmonary interstitial fibrosis.

## 2013-01-30 ENCOUNTER — Other Ambulatory Visit: Payer: Self-pay | Admitting: Physician Assistant

## 2014-07-26 ENCOUNTER — Other Ambulatory Visit: Payer: Self-pay | Admitting: Geriatric Medicine

## 2014-07-26 ENCOUNTER — Ambulatory Visit
Admission: RE | Admit: 2014-07-26 | Discharge: 2014-07-26 | Disposition: A | Discharge status: Home / Self Care | Payer: Self-pay | Source: Ambulatory Visit | Attending: Geriatric Medicine | Admitting: Geriatric Medicine

## 2014-07-26 DIAGNOSIS — J8410 Pulmonary fibrosis, unspecified: Secondary | ICD-10-CM

## 2014-07-30 ENCOUNTER — Other Ambulatory Visit (HOSPITAL_COMMUNITY): Payer: Self-pay | Admitting: Geriatric Medicine

## 2014-07-30 ENCOUNTER — Ambulatory Visit (HOSPITAL_COMMUNITY): Payer: Medicare Other | Attending: Geriatric Medicine

## 2014-07-30 DIAGNOSIS — R011 Cardiac murmur, unspecified: Secondary | ICD-10-CM | POA: Diagnosis not present

## 2014-07-30 DIAGNOSIS — I10 Essential (primary) hypertension: Secondary | ICD-10-CM | POA: Diagnosis not present

## 2014-07-30 DIAGNOSIS — E119 Type 2 diabetes mellitus without complications: Secondary | ICD-10-CM | POA: Diagnosis not present

## 2014-07-30 NOTE — Progress Notes (Signed)
2D Echo completed. 07/30/2014

## 2014-09-24 ENCOUNTER — Emergency Department (HOSPITAL_COMMUNITY)
Admission: EM | Admit: 2014-09-24 | Discharge: 2014-09-24 | Disposition: A | Discharge status: Home / Self Care | Payer: Medicare Other | Attending: Emergency Medicine | Admitting: Emergency Medicine

## 2014-09-24 ENCOUNTER — Encounter (HOSPITAL_COMMUNITY): Payer: Self-pay | Admitting: *Deleted

## 2014-09-24 DIAGNOSIS — Z87891 Personal history of nicotine dependence: Secondary | ICD-10-CM | POA: Insufficient documentation

## 2014-09-24 DIAGNOSIS — T162XXA Foreign body in left ear, initial encounter: Secondary | ICD-10-CM | POA: Insufficient documentation

## 2014-09-24 DIAGNOSIS — Z8739 Personal history of other diseases of the musculoskeletal system and connective tissue: Secondary | ICD-10-CM | POA: Insufficient documentation

## 2014-09-24 DIAGNOSIS — Z79899 Other long term (current) drug therapy: Secondary | ICD-10-CM | POA: Insufficient documentation

## 2014-09-24 DIAGNOSIS — Y939 Activity, unspecified: Secondary | ICD-10-CM | POA: Diagnosis not present

## 2014-09-24 DIAGNOSIS — X58XXXA Exposure to other specified factors, initial encounter: Secondary | ICD-10-CM | POA: Insufficient documentation

## 2014-09-24 DIAGNOSIS — Y929 Unspecified place or not applicable: Secondary | ICD-10-CM | POA: Diagnosis not present

## 2014-09-24 DIAGNOSIS — Y999 Unspecified external cause status: Secondary | ICD-10-CM | POA: Diagnosis not present

## 2014-09-24 DIAGNOSIS — Z85828 Personal history of other malignant neoplasm of skin: Secondary | ICD-10-CM | POA: Diagnosis not present

## 2014-09-24 DIAGNOSIS — I10 Essential (primary) hypertension: Secondary | ICD-10-CM | POA: Insufficient documentation

## 2014-09-24 MED ORDER — NEOMYCIN-POLYMYXIN-HC 3.5-10000-1 OT SUSP: 3.0000 [drp] | Freq: Three times a day (TID) | OTIC | Status: DC

## 2014-09-24 NOTE — Discharge Instructions (Signed)
Ear Foreign Body °An ear foreign body is an object that is stuck in the ear. It is common for young children to put objects into the ear canal. These may include pebbles, beads, beans, and any other small objects which will fit. In adults, objects such as cotton swabs may become lodged in the ear canal. In all ages, the most common foreign bodies are insects that enter the ear canal.  °SYMPTOMS  °Foreign bodies may cause pain, buzzing or roaring sounds, hearing loss, and ear drainage.  °HOME CARE INSTRUCTIONS  °· Keep all follow-up appointments with your caregiver as told. °· Keep small objects out of reach of young children. Tell them not to put anything in their ears. °SEEK IMMEDIATE MEDICAL CARE IF:  °· You have bleeding from the ear. °· You have increased pain or swelling of the ear. °· You have reduced hearing. °· You have discharge coming from the ear. °· You have a fever. °· You have a headache. °MAKE SURE YOU:  °· Understand these instructions. °· Will watch your condition. °· Will get help right away if you are not doing well or get worse. °Document Released: 06/08/2000 Document Revised: 09/03/2011 Document Reviewed: 01/28/2008 °ExitCare® Patient Information ©2015 [employer]. This information is not intended to replace advice given to you by your health care provider. Make sure you discuss any questions you have with your health care provider. ° °

## 2014-09-24 NOTE — ED Provider Notes (Signed)
CSN: [id number]     Arrival date & time 09/24/14  1948 History   First MD Initiated Contact with Patient 09/24/14 2011     Chief Complaint  Patient presents with  . Foreign Body in [place]   Patient is a 81 y.o. male presenting with foreign body in ear. The history is provided by the patient. No language interpreter was used.  Foreign Body in [place]    This chart was scribed for non-physician practitioner Rick Haring, PA-C, working with Rick Essex, MD, by Rick [name], ED Scribe. This patient was seen in room TR10C/TR10C and the patient's care was started at 8:43 PM.  Rick [name] is a 81 y.o. male who presents to the Emergency Department complaining of a foreign body in left ear. Pt states he was taking his hearing aid out of left ear a couple hours ago when a piece of it became stuck in his ear. Pt states he tried to remove the piece with tweezers but was unable to remove it. He states he also went to the fire station to try to have the piece removed but reports they were unsuccessful.  Denies pain to the ear, bleeding, headache, dizziness, neck pain.  Past Medical History  Diagnosis Date  . Hypertension   . Cancer     skin  . Osteoarthritis    History reviewed. No pertinent past surgical history. No family history on file. History  Substance Use Topics  . Smoking status: Former Research scientist (life sciences)  . Smokeless tobacco: Not on file  . Alcohol Use: No    Review of Systems  All other systems reviewed and are negative.   Allergies  Cephalexin; Codeine; and Rofecoxib  Home Medications   Prior to Admission medications   Medication Sig Start Date End Date Taking? Authorizing Provider  atenolol-chlorthalidone (TENORETIC) 100-25 MG per tablet Take 1 tablet by mouth daily.    Historical Provider, MD  neomycin-polymyxin-hydrocortisone (CORTISPORIN) 3.5-10000-1 otic suspension Place 3 drops into the left ear 3 (three) times daily. 09/24/14   Rick Schissler Carlota Raspberry, PA-C   BP 139/79 mmHg  Pulse 81   Temp(Src) 98.5 F (36.9 C)  Resp 18  SpO2 95% Physical Exam  Constitutional: He is oriented to person, place, and time. He appears well-developed and well-nourished. No distress.  HENT:  Head: Normocephalic and atraumatic.  Right Ear: Tympanic membrane and ear canal normal. No foreign bodies (no foriegn body visualized). Tympanic membrane is not injected, not perforated and not erythematous.  Left Ear: Tympanic membrane and ear canal normal.  Eyes: Conjunctivae and EOM are normal.  Neck: Neck supple.  Cardiovascular: Normal rate.   Pulmonary/Chest: Effort normal.  Musculoskeletal: Normal range of motion.  Neurological: He is alert and oriented to person, place, and time.  Skin: Skin is warm and dry.  Psychiatric: He has a normal mood and affect. His behavior is normal.  Nursing note and vitals reviewed.   ED Course  Procedures (including critical care time) DIAGNOSTIC STUDIES: Oxygen Saturation is 95% on RA, normal by my interpretation.    COORDINATION OF CARE: 8:43 PM- Pt advised of plan for treatment and pt agrees.  Labs Review Labs Reviewed - No data to display  Imaging Review No results found.   EKG Interpretation None      MDM   Final diagnoses:  Foreign body in ear, left, initial encounter    No foreign body seen, Dr. Wyvonnia [name] saw patient as well and no foreign body seen in ear canal. Will start in ciprodex  to prophylactic cover for infection.  81 y.o.Rick [name]'s evaluation in the Emergency Department is complete. It has been determined that no acute conditions requiring further emergency intervention are present at this time. The patient/guardian have been advised of the diagnosis and plan. We have discussed signs and symptoms that warrant return to the ED, such as changes or worsening in symptoms.  Vital signs are stable at discharge. Filed Vitals:   09/24/14 2044  BP: 114/65  Pulse: 70  Temp: 98.4 F (36.9 C)  Resp: 20     Patient/guardian has voiced understanding and agreed to follow-up with the PCP or specialist.  I personally performed the services described in this documentation, which was scribed in my presence. The recorded information has been reviewed and is accurate.      Rick Haring, PA-C 09/24/14 2046  Rick Essex, MD 09/25/14 [phone]

## 2014-09-24 NOTE — ED Notes (Signed)
The pt  Was changing his hearing aid this afternoon and a piece of it fell lower and he needs that piece removed so he can place his hearing aids back in

## 2015-09-09 ENCOUNTER — Other Ambulatory Visit: Payer: Self-pay | Admitting: Geriatric Medicine

## 2015-09-09 DIAGNOSIS — J8410 Pulmonary fibrosis, unspecified: Secondary | ICD-10-CM

## 2015-09-15 ENCOUNTER — Ambulatory Visit
Admission: RE | Admit: 2015-09-15 | Discharge: 2015-09-15 | Disposition: A | Discharge status: Home / Self Care | Payer: Medicare Other | Source: Ambulatory Visit | Attending: Geriatric Medicine | Admitting: Geriatric Medicine

## 2015-09-15 DIAGNOSIS — J8410 Pulmonary fibrosis, unspecified: Secondary | ICD-10-CM

## 2015-09-15 MED ORDER — IOPAMIDOL (ISOVUE-300) INJECTION 61%
75.0000 mL | Freq: Once | INTRAVENOUS | Status: AC | PRN
  Administered 2015-09-15: 75 mL via INTRAVENOUS

## 2015-10-24 ENCOUNTER — Other Ambulatory Visit: Payer: Self-pay

## 2015-10-25 ENCOUNTER — Other Ambulatory Visit (INDEPENDENT_AMBULATORY_CARE_PROVIDER_SITE_OTHER): Payer: Medicare Other

## 2015-10-25 ENCOUNTER — Ambulatory Visit (INDEPENDENT_AMBULATORY_CARE_PROVIDER_SITE_OTHER): Payer: Medicare Other | Admitting: Pulmonary Disease

## 2015-10-25 ENCOUNTER — Encounter: Payer: Self-pay | Admitting: Pulmonary Disease

## 2015-10-25 VITALS — BP 116/64 | HR 62 | Ht 68.0 in | Wt 121.8 lb

## 2015-10-25 DIAGNOSIS — J8410 Pulmonary fibrosis, unspecified: Secondary | ICD-10-CM | POA: Diagnosis not present

## 2015-10-25 LAB — C-REACTIVE PROTEIN: CRP: 0.2 mg/dL — AB (ref 0.5–20.0)

## 2015-10-25 LAB — SEDIMENTATION RATE: SED RATE: 32 mm/h — AB (ref 0–22)

## 2015-10-25 LAB — RHEUMATOID FACTOR: RHEUMATOID FACTOR: 16 [IU]/mL — AB (ref <=14)

## 2015-10-25 NOTE — Patient Instructions (Addendum)
We will send her for blood tests and pulmonary function tests to further evaluate her lung fibrosis.  Based on these results to me the a candidate for therapy with drugs such as pirfenidone   We will schedule you for a return appointment in 1 month to discuss these results and therapies.

## 2015-10-25 NOTE — Progress Notes (Signed)
Subjective:    Patient ID: Rick [name], male    DOB: 1925/10/07, 82 y.o.   MRN: [id number]  HPI Consult for evaluation of pulmonary fibrosis.  Rick [name] is a 82 year old with past medical history as below. He's been referred here for evaluation of pulmonary fibrosis. Process. This was noted on a CT scan in 2011. He's had a recent scan in March 2017 which showed worsening fibrosis. His chief complaint is cough, nonproductive in nature. He has allergic rhinitis, postnasal drip and GERD, Barrett's esophagus. He is being maintained on Prilosec with good control of his heartburn symptoms.  He is undergoing a lot of personal stressors. His daughter was diagnosed with a brain tumor and his sister passed away from breast cancer in October 2016. He's lost considerable weight since then.  DATA:  PFT 12/01/09  FEV1 2.64(114%),  FEV1% 84,  TLC 5.22(93%),  DLCO 89%,  no BD  CT scan 09/15/15 Pulmonary fibrosis and UIP pattern. Worse since 2011. Images reviewed.  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 No asbestos exposure. He has remote smoking history. Quit at the age of 82.  Family history: Sister-breast cancer Father-tuberculosis.  Past Medical History  Diagnosis Date  . Hypertension   . Cancer ([place])     skin  . Osteoarthritis   . Skin cancer   . Presbycusis of both ears   . Diabetes ([place])   . Fibrotic lung diseases ([place])   . Heart murmur     Current outpatient prescriptions:  .  aspirin EC 81 MG tablet, Take 1 tablet (81 mg total) by mouth daily., Disp: 30 tablet, Rfl: 0 .  atenolol-chlorthalidone (TENORETIC) 100-25 MG per tablet, Take 1 tablet by mouth daily., Disp: , Rfl:  .  Cholecalciferol (VITAMIN D3) 1000 units CAPS, Take 1 capsule (1,000 Units total) by mouth daily., Disp: 60 capsule, Rfl:  .  Chromium 1000 MCG TABS, Take 2 tablets (2,000 mcg total) by mouth daily., Disp: 60 tablet, Rfl: 0 .  Cinnamon  500 MG capsule, Take 2 capsules (1,000 mg total) by mouth daily., Disp: 60 capsule, Rfl: 0 .  Flaxseed, Linseed, (FLAXSEED OIL) 1000 MG CAPS, Take 2 capsules (2,000 mg total) by mouth daily., Disp: 60 capsule, Rfl: 0 .  metFORMIN (GLUCOPHAGE-XR) 500 MG 24 hr tablet, Take 1 tablet (500 mg total) by mouth daily with breakfast., Disp: 30 tablet, Rfl: 0 .  Multiple Vitamins-Minerals (MULTIVITAMIN) tablet, Take 1 tablet by mouth daily., Disp: 30 tablet, Rfl: 0 .  neomycin-polymyxin-hydrocortisone (CORTISPORIN) 3.5-10000-1 otic suspension, Place 3 drops into the left ear 3 (three) times daily., Disp: 10 mL, Rfl: 0 .  Omega-3 Fatty Acids (FISH OIL) 600 MG CAPS, Take 1 capsule by mouth daily., Disp: 30 capsule, Rfl: 0 .  Omeprazole 20 MG TBEC, Take 1 tablet (20 mg total) by mouth daily., Disp: 30 each, Rfl: 0 .  cetirizine (ZYRTEC) 5 MG tablet, Take 10 mg by mouth daily. Reported on 10/25/2015, Disp: 30 tablet, Rfl: 0 .  fluticasone (FLONASE) 50 MCG/ACT nasal spray, Place 2 sprays into both nostrils daily. Reported on 10/25/2015, Disp: 16 g, Rfl: 2  Review of Systems Cough, no sputum production, wheezing, hemoptysis. No fevers, chills. No loss of appetite. No chest pain, palpitation. No nausea, vomiting, diarrhea, consultation. All other review of systems are negative.    Objective:   Physical Exam Blood pressure 116/64, pulse 62, height 5\' 8"  (1.727 m), weight  121 lb 12.8 oz (55.248 kg), SpO2 99 %. Gen: No apparent distress Neuro: No gross focal deficits. HEENT: No JVD, lymphadenopathy, thyromegaly. RS: Clear, No wheeze or crackles CVS: S1-S2 heard, no murmurs rubs gallops. Abdomen: Soft, positive bowel sounds. Musculoskeletal: No edema.    Assessment & Plan:  #1 Pulmonary fibrosis, UIP pattern. There has been a worsening of primary fibrosis on CT imaging from 2000 to 2017. His main complaint today is non productive cough. He has H/O GERD and acid reflux that may be contributing to the basal  fibrosis. There is no evidence of exposure, hypersensitivity. I will evaluate by sending serologies for connective tissue, autoimmune disease. Based on those results he may be a candidate for  pirfenidone or nintedanib. I discussed these therapies with him including possible side effects. He is not sure if he wants to go on these therapies and wants to discuss this with his family before deciding. I will see him in clinic in 1 month to review the results and further treatment as needed.  #2 Weight loss, Pancreatic, bile duct dilation. This is worrisome for a malignancy. He will need a dedicated CT of the pancreas for further evaluation. He has a follow-up with his PCP Dr. Felipa Eth soon. I will alert him about finding and further follow-up.  Plan: - Serologies for CTD and autoimmune disease - Send results of CT to PCP - Follow up in 1 month.  Marshell Garfinkel MD [facility] Pager [phone] If no answer or after 3pm call: [phone] 10/31/2015, 9:09 AM

## 2015-10-26 LAB — ANTI-SCLERODERMA ANTIBODY: SCLERODERMA (SCL-70) (ENA) ANTIBODY, IGG: NEGATIVE

## 2015-10-26 LAB — RNP ANTIBODY: RIBONUCLEIC PROTEIN(ENA) ANTIBODY, IGG: NEGATIVE

## 2015-10-26 LAB — CYCLIC CITRUL PEPTIDE ANTIBODY, IGG

## 2015-10-26 LAB — CENTROMERE ANTIBODIES: Centromere Ab Screen: <1

## 2015-10-26 LAB — JO-1 ANTIBODY-IGG: JO-1 ANTIBODY, IGG: NEGATIVE

## 2015-10-26 LAB — SJOGREN'S SYNDROME ANTIBODS(SSA + SSB)
SSA (Ro) (ENA) Antibody, IgG: <1
SSB (LA) (ENA) ANTIBODY, IGG: NEGATIVE

## 2015-10-26 LAB — ANTI-SMITH ANTIBODY: ENA SM AB SER-ACNC: NEGATIVE

## 2015-10-26 LAB — ANTI-DNA ANTIBODY, DOUBLE-STRANDED: ds DNA Ab: 2 IU/mL

## 2015-10-27 LAB — ALDOLASE: Aldolase: 5.1 U/L (ref <=8.1)

## 2015-10-27 LAB — ANCA SCREEN W REFLEX TITER: ANCA Screen: POSITIVE — AB

## 2015-10-27 LAB — C-ANCA TITER: C-ANCA: 1:40 {titer} — ABNORMAL HIGH

## 2015-10-31 ENCOUNTER — Telehealth: Payer: Self-pay | Admitting: Pulmonary Disease

## 2015-10-31 NOTE — Telephone Encounter (Signed)
[phone], [place] cb

## 2015-10-31 NOTE — Telephone Encounter (Signed)
LMOM TCB x1 for [place]

## 2015-10-31 NOTE — Telephone Encounter (Signed)
Per PM: pt had an abnormal CT and will need a CT Abd, but he would like Dr Felipa Eth to follow.  Please call his office.  LMOM TCB x1 for Dr Carlyle Lipa nurse [place].

## 2015-11-04 NOTE — Telephone Encounter (Signed)
LMOM TCB x2 for [place]

## 2015-11-07 ENCOUNTER — Other Ambulatory Visit: Payer: Self-pay | Admitting: Geriatric Medicine

## 2015-11-07 DIAGNOSIS — K8689 Other specified diseases of pancreas: Secondary | ICD-10-CM

## 2015-11-07 LAB — ANA COMPREHENSIVE PANEL
Anti JO-1: <0.2 AI (ref 0.0–0.9)
Centromere Ab Screen: <0.2 AI (ref 0.0–0.9)
DSDNA AB: 1 [IU]/mL (ref 0–9)
ENA RNP Ab: <0.2 AI (ref 0.0–0.9)
ENA SSA (RO) Ab: <0.2 AI (ref 0.0–0.9)
ENA SSB (LA) Ab: <0.2 AI (ref 0.0–0.9)

## 2015-11-07 LAB — MYOSITIS PANEL III
EJ*: NEGATIVE
Jo-1 (WB)*: NEGATIVE
KU: NEGATIVE
MI-2 ANTIBODIES: NEGATIVE
OJ*: NEGATIVE
PL-12*: NEGATIVE
PL-7*: NEGATIVE
PM-SCL 100: NEGATIVE
PM-SCL 75: NEGATIVE
RNP: 22.8 EU/ml
RO-52: NEGATIVE
Signal Recognition Particle*: NEGATIVE

## 2015-11-07 NOTE — Telephone Encounter (Signed)
Spoke with [place] @ Dr Carlyle Lipa office. She states that pt has appt there today. They are aware of CT findings. Nothing further needed.

## 2015-11-07 NOTE — Telephone Encounter (Signed)
[place] returned call.  Asked to call [phone] and have the operator overhead page her.

## 2015-11-08 ENCOUNTER — Inpatient Hospital Stay: Admission: RE | Admit: 2015-11-08 | Payer: Medicare Other | Source: Ambulatory Visit

## 2015-11-14 ENCOUNTER — Ambulatory Visit
Admission: RE | Admit: 2015-11-14 | Discharge: 2015-11-14 | Disposition: A | Discharge status: Home / Self Care | Payer: Medicare Other | Source: Ambulatory Visit | Attending: Geriatric Medicine | Admitting: Geriatric Medicine

## 2015-11-14 DIAGNOSIS — K8689 Other specified diseases of pancreas: Secondary | ICD-10-CM

## 2015-11-18 ENCOUNTER — Other Ambulatory Visit: Payer: Self-pay | Admitting: Geriatric Medicine

## 2015-11-18 DIAGNOSIS — K8689 Other specified diseases of pancreas: Secondary | ICD-10-CM

## 2015-11-29 ENCOUNTER — Ambulatory Visit
Admission: RE | Admit: 2015-11-29 | Discharge: 2015-11-29 | Disposition: A | Discharge status: Home / Self Care | Payer: Medicare Other | Source: Ambulatory Visit | Attending: Geriatric Medicine | Admitting: Geriatric Medicine

## 2015-11-29 DIAGNOSIS — K8689 Other specified diseases of pancreas: Secondary | ICD-10-CM

## 2015-11-29 MED ORDER — GADOBENATE DIMEGLUMINE 529 MG/ML IV SOLN
10.0000 mL | Freq: Once | INTRAVENOUS | Status: AC | PRN
  Administered 2015-11-29: 10 mL via INTRAVENOUS

## 2016-01-04 ENCOUNTER — Ambulatory Visit: Payer: Medicare Other | Admitting: Pulmonary Disease

## 2016-06-07 ENCOUNTER — Other Ambulatory Visit (HOSPITAL_COMMUNITY): Payer: Self-pay | Admitting: Nurse Practitioner

## 2016-06-07 DIAGNOSIS — K869 Disease of pancreas, unspecified: Secondary | ICD-10-CM

## 2016-06-08 ENCOUNTER — Other Ambulatory Visit (HOSPITAL_COMMUNITY): Payer: Self-pay | Admitting: Nurse Practitioner

## 2016-06-08 DIAGNOSIS — K869 Disease of pancreas, unspecified: Secondary | ICD-10-CM

## 2016-06-10 ENCOUNTER — Ambulatory Visit (HOSPITAL_COMMUNITY)
Admission: RE | Admit: 2016-06-10 | Discharge: 2016-06-10 | Disposition: A | Discharge status: Home / Self Care | Payer: Medicare Other | Source: Ambulatory Visit | Attending: Nurse Practitioner | Admitting: Nurse Practitioner

## 2016-06-10 DIAGNOSIS — N281 Cyst of kidney, acquired: Secondary | ICD-10-CM | POA: Diagnosis not present

## 2016-06-10 DIAGNOSIS — K869 Disease of pancreas, unspecified: Secondary | ICD-10-CM | POA: Insufficient documentation

## 2016-06-10 DIAGNOSIS — K760 Fatty (change of) liver, not elsewhere classified: Secondary | ICD-10-CM | POA: Insufficient documentation

## 2016-06-10 MED ORDER — GADOBENATE DIMEGLUMINE 529 MG/ML IV SOLN
11.0000 mL | Freq: Once | INTRAVENOUS | Status: AC | PRN
  Administered 2016-06-10: 11 mL via INTRAVENOUS

## 2016-06-11 ENCOUNTER — Other Ambulatory Visit: Payer: Self-pay | Admitting: Gastroenterology

## 2016-06-11 ENCOUNTER — Encounter (HOSPITAL_COMMUNITY): Payer: Self-pay

## 2016-06-12 ENCOUNTER — Encounter (HOSPITAL_COMMUNITY): Payer: Self-pay

## 2016-06-13 ENCOUNTER — Ambulatory Visit (HOSPITAL_COMMUNITY): Payer: Medicare Other | Admitting: Anesthesiology

## 2016-06-13 ENCOUNTER — Ambulatory Visit (HOSPITAL_COMMUNITY)
Admission: RE | Admit: 2016-06-13 | Discharge: 2016-06-13 | Disposition: A | Discharge status: Home / Self Care | Payer: Medicare Other | Source: Ambulatory Visit | Attending: Gastroenterology | Admitting: Gastroenterology

## 2016-06-13 ENCOUNTER — Ambulatory Visit (HOSPITAL_COMMUNITY): Payer: Medicare Other

## 2016-06-13 ENCOUNTER — Encounter (HOSPITAL_COMMUNITY): Payer: Self-pay

## 2016-06-13 ENCOUNTER — Encounter (HOSPITAL_COMMUNITY)
Admission: RE | Disposition: A | Discharge status: Home / Self Care | Payer: Self-pay | Source: Ambulatory Visit | Attending: Gastroenterology

## 2016-06-13 DIAGNOSIS — R935 Abnormal findings on diagnostic imaging of other abdominal regions, including retroperitoneum: Secondary | ICD-10-CM | POA: Diagnosis present

## 2016-06-13 DIAGNOSIS — K831 Obstruction of bile duct: Secondary | ICD-10-CM | POA: Diagnosis not present

## 2016-06-13 DIAGNOSIS — K219 Gastro-esophageal reflux disease without esophagitis: Secondary | ICD-10-CM | POA: Insufficient documentation

## 2016-06-13 DIAGNOSIS — K862 Cyst of pancreas: Secondary | ICD-10-CM | POA: Insufficient documentation

## 2016-06-13 DIAGNOSIS — Z7984 Long term (current) use of oral hypoglycemic drugs: Secondary | ICD-10-CM | POA: Insufficient documentation

## 2016-06-13 DIAGNOSIS — Z79899 Other long term (current) drug therapy: Secondary | ICD-10-CM | POA: Insufficient documentation

## 2016-06-13 DIAGNOSIS — Z7982 Long term (current) use of aspirin: Secondary | ICD-10-CM | POA: Diagnosis not present

## 2016-06-13 DIAGNOSIS — E119 Type 2 diabetes mellitus without complications: Secondary | ICD-10-CM | POA: Diagnosis not present

## 2016-06-13 DIAGNOSIS — R17 Unspecified jaundice: Secondary | ICD-10-CM

## 2016-06-13 DIAGNOSIS — I10 Essential (primary) hypertension: Secondary | ICD-10-CM | POA: Insufficient documentation

## 2016-06-13 DIAGNOSIS — Z87891 Personal history of nicotine dependence: Secondary | ICD-10-CM | POA: Diagnosis not present

## 2016-06-13 HISTORY — PX: ERCP: SHX5425

## 2016-06-13 HISTORY — DX: Neoplasm of unspecified behavior of unspecified site: D49.9

## 2016-06-13 LAB — GLUCOSE, CAPILLARY: Glucose-Capillary: 138 mg/dL — ABNORMAL HIGH (ref 65–99)

## 2016-06-13 SURGERY — ERCP, WITH INTERVENTION IF INDICATED
Anesthesia: General

## 2016-06-13 MED ORDER — PROPOFOL 10 MG/ML IV BOLUS
INTRAVENOUS | Status: DC | PRN
  Administered 2016-06-13: 120 mg via INTRAVENOUS

## 2016-06-13 MED ORDER — INDOMETHACIN 50 MG RE SUPP
RECTAL | Status: AC
  Filled 2016-06-13: qty 2

## 2016-06-13 MED ORDER — PHENYLEPHRINE HCL 10 MG/ML IJ SOLN
INTRAMUSCULAR | Status: AC
  Filled 2016-06-13: qty 1

## 2016-06-13 MED ORDER — GLUCAGON HCL RDNA (DIAGNOSTIC) 1 MG IJ SOLR
INTRAMUSCULAR | Status: AC
  Filled 2016-06-13: qty 1

## 2016-06-13 MED ORDER — ROCURONIUM BROMIDE 50 MG/5ML IV SOSY
PREFILLED_SYRINGE | INTRAVENOUS | Status: AC
  Filled 2016-06-13: qty 5

## 2016-06-13 MED ORDER — ONDANSETRON HCL 4 MG/2ML IJ SOLN
INTRAMUSCULAR | Status: DC | PRN
  Administered 2016-06-13: 4 mg via INTRAVENOUS

## 2016-06-13 MED ORDER — LIDOCAINE 2% (20 MG/ML) 5 ML SYRINGE
INTRAMUSCULAR | Status: AC
  Filled 2016-06-13: qty 5

## 2016-06-13 MED ORDER — SUCCINYLCHOLINE CHLORIDE 200 MG/10ML IV SOSY
PREFILLED_SYRINGE | INTRAVENOUS | Status: DC | PRN
  Administered 2016-06-13: 120 mg via INTRAVENOUS

## 2016-06-13 MED ORDER — ONDANSETRON HCL 4 MG/2ML IJ SOLN
INTRAMUSCULAR | Status: AC
  Filled 2016-06-13: qty 2

## 2016-06-13 MED ORDER — FENTANYL CITRATE (PF) 100 MCG/2ML IJ SOLN
INTRAMUSCULAR | Status: DC | PRN
  Administered 2016-06-13: 50 ug via INTRAVENOUS
  Administered 2016-06-13: 25 ug via INTRAVENOUS

## 2016-06-13 MED ORDER — CIPROFLOXACIN IN D5W 400 MG/200ML IV SOLN
INTRAVENOUS | Status: AC
  Filled 2016-06-13: qty 200

## 2016-06-13 MED ORDER — PHENYLEPHRINE HCL 10 MG/ML IJ SOLN
INTRAMUSCULAR | Status: DC | PRN
  Administered 2016-06-13 (×3): 80 ug via INTRAVENOUS

## 2016-06-13 MED ORDER — SODIUM CHLORIDE 0.9 % IV SOLN: INTRAVENOUS | Status: DC

## 2016-06-13 MED ORDER — SODIUM CHLORIDE 0.9 % IV SOLN
INTRAVENOUS | Status: DC | PRN
  Administered 2016-06-13: 40 mL

## 2016-06-13 MED ORDER — LIDOCAINE 2% (20 MG/ML) 5 ML SYRINGE
INTRAMUSCULAR | Status: DC | PRN
Start: 2016-06-13 — End: 2016-06-13
  Administered 2016-06-13: 100 mg via INTRAVENOUS

## 2016-06-13 MED ORDER — LACTATED RINGERS IV SOLN
INTRAVENOUS | Status: DC
  Administered 2016-06-13: 1000 mL via INTRAVENOUS
  Administered 2016-06-13: 13:00:00 via INTRAVENOUS

## 2016-06-13 MED ORDER — SUCCINYLCHOLINE CHLORIDE 200 MG/10ML IV SOSY
PREFILLED_SYRINGE | INTRAVENOUS | Status: AC
  Filled 2016-06-13: qty 10

## 2016-06-13 MED ORDER — PHENYLEPHRINE HCL 10 MG/ML IJ SOLN
INTRAVENOUS | Status: DC | PRN
  Administered 2016-06-13: 50 ug/min via INTRAVENOUS

## 2016-06-13 MED ORDER — PROPOFOL 10 MG/ML IV BOLUS
INTRAVENOUS | Status: AC
  Filled 2016-06-13: qty 20

## 2016-06-13 MED ORDER — CIPROFLOXACIN IN D5W 400 MG/200ML IV SOLN
INTRAVENOUS | Status: DC | PRN
  Administered 2016-06-13: 400 mg via INTRAVENOUS

## 2016-06-13 MED ORDER — FENTANYL CITRATE (PF) 100 MCG/2ML IJ SOLN
INTRAMUSCULAR | Status: AC
  Filled 2016-06-13: qty 2

## 2016-06-13 NOTE — Discharge Instructions (Signed)
YOU HAD AN ENDOSCOPIC PROCEDURE TODAY: Refer to the procedure report and other information in the discharge instructions given to you for any specific questions about what was found during the examination. If this information does not answer your questions, please call [facility] office at [phone] to clarify.   YOU SHOULD EXPECT: Some feelings of bloating in the abdomen. Passage of more gas than usual. Walking can help get rid of the air that was put into your GI tract during the procedure and reduce the bloating. If you had a lower endoscopy (such as a colonoscopy or flexible sigmoidoscopy) you may notice spotting of blood in your stool or on the toilet paper. Some abdominal soreness may be present for a day or two, also.  DIET: Your first meal following the procedure should be a light meal and then it is ok to progress to your normal diet. A half-sandwich or bowl of soup is an example of a good first meal. Heavy or fried foods are harder to digest and may make you feel nauseous or bloated. Drink plenty of fluids but you should avoid alcoholic beverages for 24 hours. If you had a esophageal dilation, please see attached instructions for diet.   ACTIVITY: Your care partner should take you home directly after the procedure. You should plan to take it easy, moving slowly for the rest of the day. You can resume normal activity the day after the procedure however YOU SHOULD NOT DRIVE, use power tools, machinery or perform tasks that involve climbing or major physical exertion for 24 hours (because of the sedation medicines used during the test).   SYMPTOMS TO REPORT IMMEDIATELY: A gastroenterologist can be reached at any hour. Please call [phone]  for any of the following symptoms:  Following lower endoscopy (colonoscopy, flexible sigmoidoscopy) Excessive amounts of blood in the stool  Significant tenderness, worsening of abdominal pains  Swelling of the abdomen that is new, acute  Fever of 100 or  higher  Following upper endoscopy (EGD, EUS, ERCP, esophageal dilation) Vomiting of blood or coffee ground material  New, significant abdominal pain  New, significant chest pain or pain under the shoulder blades  Painful or persistently difficult swallowing  New shortness of breath  Black, tarry-looking or red, bloody stools  FOLLOW UP:  If any biopsies were taken you will be contacted by phone or by letter within the next 1-3 weeks. Call [phone]  if you have not heard about the biopsies in 3 weeks.  Please also call with any specific questions about appointments or follow up tests. Call if question or problem otherwise clear liquid diet until 6 PM and if doing well may have soft solids and if doing well follow-up with Dr. Paulita Fujita in 2 weeks to repeat lab work and to check on him

## 2016-06-13 NOTE — Anesthesia Preprocedure Evaluation (Signed)
Anesthesia Evaluation  Patient identified by MRN, date of birth, ID band Patient awake    Reviewed: Allergy & Precautions, NPO status , Patient's Chart, lab work & pertinent test results  Airway Mallampati: I  TM Distance: >3 FB Neck ROM: Full    Dental   Pulmonary former smoker,    Pulmonary exam normal        Cardiovascular hypertension, Pt. on medications Normal cardiovascular exam     Neuro/Psych    GI/Hepatic GERD  Medicated and Controlled,  Endo/Other  diabetes, Type 2, Oral Hypoglycemic Agents  Renal/GU      Musculoskeletal   Abdominal   Peds  Hematology   Anesthesia Other Findings   Reproductive/Obstetrics                             Anesthesia Physical Anesthesia Plan  ASA: III  Anesthesia Plan: General   Post-op Pain Management:    Induction: Intravenous  Airway Management Planned: Oral ETT  Additional Equipment:   Intra-op Plan:   Post-operative Plan: Extubation in OR  Informed Consent: I have reviewed the patients History and Physical, chart, labs and discussed the procedure including the risks, benefits and alternatives for the proposed anesthesia with the patient or authorized representative who has indicated his/her understanding and acceptance.     Plan Discussed with: CRNA and Surgeon  Anesthesia Plan Comments:         Anesthesia Quick Evaluation

## 2016-06-13 NOTE — Transfer of Care (Signed)
Immediate Anesthesia Transfer of Care Note  Patient: Rick [name]  Procedure(s) Performed: Procedure(s): ENDOSCOPIC RETROGRADE CHOLANGIOPANCREATOGRAPHY (ERCP) (N/A)  Patient Location: PACU  Anesthesia Type:General  Level of Consciousness: sedated  Airway & Oxygen Therapy: Patient Spontanous Breathing and Patient connected to face mask oxygen  Post-op Assessment: Report given to RN and Post -op Vital signs reviewed and stable  Post vital signs: Reviewed and stable  Last Vitals:  Vitals:   06/13/16 1045  BP: (!) 160/70  Pulse: (!) 58  Resp: (!) 27  Temp: 36.6 C    Last Pain:  Vitals:   06/13/16 1045  TempSrc: Oral         Complications: No apparent anesthesia complications

## 2016-06-13 NOTE — Progress Notes (Signed)
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

## 2016-06-13 NOTE — Anesthesia Postprocedure Evaluation (Signed)
Anesthesia Post Note  Patient: Rick [name]  Procedure(s) Performed: Procedure(s) (LRB): ENDOSCOPIC RETROGRADE CHOLANGIOPANCREATOGRAPHY (ERCP) (N/A)  Patient location during evaluation: PACU Anesthesia Type: General Level of consciousness: awake and alert Pain management: pain level controlled Vital Signs Assessment: post-procedure vital signs reviewed and stable Respiratory status: spontaneous breathing, nonlabored ventilation, respiratory function stable and patient connected to nasal cannula oxygen Cardiovascular status: blood pressure returned to baseline and stable Postop Assessment: no signs of nausea or vomiting Anesthetic complications: no       Last Vitals:  Vitals:   06/13/16 1400 06/13/16 1410  BP: (!) 160/114 (!) (P) 186/73  Pulse: (!) 52   Resp: (!) 26   Temp:      Last Pain:  Vitals:   06/13/16 1330  TempSrc: Oral                 Leodan Bolyard DAVID

## 2016-06-13 NOTE — Anesthesia Procedure Notes (Signed)
Procedure Name: Intubation Date/Time: 06/13/2016 12:11 PM Performed by: Lind Covert Pre-anesthesia Checklist: Patient identified, Emergency Drugs available, Suction available, Patient being monitored and Timeout performed Patient Re-evaluated:Patient Re-evaluated prior to inductionOxygen Delivery Method: Circle system utilized Preoxygenation: Pre-oxygenation with 100% oxygen Intubation Type: IV induction Laryngoscope Size: Mac and 4 Grade View: Grade I Tube type: Oral Tube size: 7.5 mm Number of attempts: 1 Airway Equipment and Method: Stylet Placement Confirmation: ETT inserted through vocal cords under direct vision,  breath sounds checked- equal and bilateral and positive ETCO2 Secured at: 22 cm Tube secured with: Tape Dental Injury: Teeth and Oropharynx as per pre-operative assessment

## 2016-06-13 NOTE — Op Note (Signed)
[facility] Patient Name: Rick [name] Procedure Date: 06/13/2016 MRN: [id number] Attending MD: Clarene Essex , MD Date of Birth: 25-Nov-1925 CSN: [id number] Age: 82 Admit Type: Outpatient Procedure:                ERCP Indications:              Abnormal abdominal MRI, Pancreatic cyst(s) on                            magnetic resonance cholangiopancreatography,                            Biliary dilation on magnetic resonance imaging Providers:                Clarene Essex, MD, Carolynn Comment, RN, Elspeth Cho Tech., Technician, Derrek Gu. Alday CRNA,                            CRNA, Otis Brace, MD Referring MD:              Medicines:                Sedation Administered by an Anesthesia                            Professional, General Anesthesia Complications:            No immediate complications. Estimated Blood Loss:     Estimated blood loss: none. Procedure:                Pre-Anesthesia Assessment:                           - Prior to the procedure, a History and Physical                            was performed, and patient medications and                            allergies were reviewed. The patient's tolerance of                            previous anesthesia was also reviewed. The risks                            and benefits of the procedure and the sedation                            options and risks were discussed with the patient.                            All questions were answered, and informed consent                            was obtained. Prior Anticoagulants: The  patient has                            taken no previous anticoagulant or antiplatelet                            agents. ASA Grade Assessment: III - A patient with                            severe systemic disease. After reviewing the risks                            and benefits, the patient was deemed in                            satisfactory  condition to undergo the procedure.                           After obtaining informed consent, the scope was                            passed under direct vision. Throughout the                            procedure, the patient's blood pressure, pulse, and                            oxygen saturations were monitored continuously. The                            [id number]) scope was introduced through                            the mouth, and used to inject contrast into and                            used to inject contrast into the bile duct. The                            patient tolerated the procedure well. The ERCP was                            technically difficult and complex. Successful                            completion of the procedure was aided by                            straightening and shortening the scope to obtain                            bowel loop reduction. The patient tolerated the  procedure well.                           Duodenoscope was advised into the esophagus, and                            duodenum. Scope was reduced. Ampullary orifice was                            identified. Ampulla appeared bulgy. There was mucin                            exuding from ampullary orifice. Once this was                            identified, cannulation was attempted. Initially                            wire was advanced in to the PD. Subsequently,                            selective biliary cannulation was performed with                            the deep cannulation. Contrast injection was                            performed. This showed significantly dilated CBD of                            more than 2 cm. There was no filling defect. There                            was distal CBD stricture about 5 cm with                            irregularity with contrast injection. A 15 mm                            balloon was used  for balloon sweep. I was not able                            to pull 15 mm balloon through stricture. Balloon                            was reduced in size with subsequently successful                            balloon pull-through. After this, an adequate                            sphincterotomy was performed. Following  sphincterotomy, placement of 10 French by 8 cm                            fully covered metal stent was performed. Stent was                            somewhat more extending out of the ampullary                            orrifice then we would like it to be but there was                            good drainage at the end of procedure. Scope was                            withdrawn. Procedure was completed. Scope In: Scope Out: Findings:      The major papilla was bulging. The major papilla was exuding mucin.      Significantly dilated CBD of more than 2 cm with the distal CBD stricture      Sphincterotomy was performed. 10 Fr X 8 CM Fully Covered Metal stent was       placed. Impression:               - The major papilla appeared to be bulging.                           - The major papilla appeared to be exuding mucin. Moderate Sedation:      N/A- Per Anesthesia Care Recommendation:           - Clear liquid diet today.                           - Continue present medications.                           - Return to GI clinic in 2 weeks.                           - Telephone GI clinic if symptomatic PRN.                           - Check liver enzymes (AST, ALT, alkaline                            phosphatase, bilirubin) in 2 weeks. Procedure Code(s):        --- Professional ---                           [phone], Endoscopic retrograde                            cholangiopancreatography (ERCP); diagnostic,                            including collection of specimen(s) by brushing or  washing, when performed  (separate procedure) Diagnosis Code(s):        --- Professional ---                           K83.9, Disease of biliary tract, unspecified                           K86.2, Cyst of pancreas                           R93.5, Abnormal findings on diagnostic imaging of                            other abdominal regions, including retroperitoneum CPT copyright 2016 American Medical Association. All rights reserved. The codes documented in this report are preliminary and upon coder review may  be revised to meet current compliance requirements. Clarene Essex, MD 06/13/2016 1:26:25 PM This report has been signed electronically. Otis Brace, MD Number of Addenda: 0

## 2016-06-14 ENCOUNTER — Encounter (HOSPITAL_COMMUNITY): Payer: Self-pay | Admitting: Gastroenterology

## 2016-07-19 ENCOUNTER — Other Ambulatory Visit: Payer: Self-pay | Admitting: Gastroenterology

## 2016-07-19 ENCOUNTER — Emergency Department (HOSPITAL_COMMUNITY): Payer: Medicare Other

## 2016-07-19 ENCOUNTER — Encounter (HOSPITAL_COMMUNITY): Payer: Self-pay

## 2016-07-19 ENCOUNTER — Other Ambulatory Visit: Payer: Self-pay | Admitting: Nurse Practitioner

## 2016-07-19 ENCOUNTER — Inpatient Hospital Stay (HOSPITAL_COMMUNITY)
Admission: EM | Admit: 2016-07-19 | Discharge: 2016-07-23 | DRG: 202 | Disposition: A | Discharge status: Skilled Nursing Facility | Payer: Medicare Other | Attending: Internal Medicine | Admitting: Internal Medicine

## 2016-07-19 ENCOUNTER — Ambulatory Visit
Admission: RE | Admit: 2016-07-19 | Discharge: 2016-07-19 | Disposition: A | Discharge status: Home / Self Care | Payer: Medicare Other | Source: Ambulatory Visit | Attending: Nurse Practitioner | Admitting: Nurse Practitioner

## 2016-07-19 DIAGNOSIS — E876 Hypokalemia: Secondary | ICD-10-CM | POA: Diagnosis present

## 2016-07-19 DIAGNOSIS — R05 Cough: Secondary | ICD-10-CM

## 2016-07-19 DIAGNOSIS — Z881 Allergy status to other antibiotic agents status: Secondary | ICD-10-CM

## 2016-07-19 DIAGNOSIS — R7989 Other specified abnormal findings of blood chemistry: Secondary | ICD-10-CM | POA: Diagnosis present

## 2016-07-19 DIAGNOSIS — E43 Unspecified severe protein-calorie malnutrition: Secondary | ICD-10-CM | POA: Diagnosis present

## 2016-07-19 DIAGNOSIS — H9113 Presbycusis, bilateral: Secondary | ICD-10-CM | POA: Diagnosis present

## 2016-07-19 DIAGNOSIS — J8410 Pulmonary fibrosis, unspecified: Secondary | ICD-10-CM | POA: Diagnosis present

## 2016-07-19 DIAGNOSIS — M1990 Unspecified osteoarthritis, unspecified site: Secondary | ICD-10-CM | POA: Diagnosis present

## 2016-07-19 DIAGNOSIS — R059 Cough, unspecified: Secondary | ICD-10-CM

## 2016-07-19 DIAGNOSIS — Z883 Allergy status to other anti-infective agents status: Secondary | ICD-10-CM | POA: Diagnosis not present

## 2016-07-19 DIAGNOSIS — I110 Hypertensive heart disease with heart failure: Secondary | ICD-10-CM | POA: Diagnosis present

## 2016-07-19 DIAGNOSIS — E11649 Type 2 diabetes mellitus with hypoglycemia without coma: Secondary | ICD-10-CM | POA: Diagnosis present

## 2016-07-19 DIAGNOSIS — K219 Gastro-esophageal reflux disease without esophagitis: Secondary | ICD-10-CM | POA: Diagnosis present

## 2016-07-19 DIAGNOSIS — Z7982 Long term (current) use of aspirin: Secondary | ICD-10-CM

## 2016-07-19 DIAGNOSIS — I10 Essential (primary) hypertension: Secondary | ICD-10-CM | POA: Diagnosis present

## 2016-07-19 DIAGNOSIS — Z681 Body mass index (BMI) 19 or less, adult: Secondary | ICD-10-CM

## 2016-07-19 DIAGNOSIS — D649 Anemia, unspecified: Secondary | ICD-10-CM | POA: Diagnosis present

## 2016-07-19 DIAGNOSIS — I5032 Chronic diastolic (congestive) heart failure: Secondary | ICD-10-CM | POA: Diagnosis present

## 2016-07-19 DIAGNOSIS — I493 Ventricular premature depolarization: Secondary | ICD-10-CM | POA: Diagnosis present

## 2016-07-19 DIAGNOSIS — Z9689 Presence of other specified functional implants: Secondary | ICD-10-CM | POA: Diagnosis present

## 2016-07-19 DIAGNOSIS — R69 Illness, unspecified: Secondary | ICD-10-CM | POA: Diagnosis not present

## 2016-07-19 DIAGNOSIS — Z79899 Other long term (current) drug therapy: Secondary | ICD-10-CM | POA: Diagnosis not present

## 2016-07-19 DIAGNOSIS — J209 Acute bronchitis, unspecified: Secondary | ICD-10-CM | POA: Diagnosis not present

## 2016-07-19 DIAGNOSIS — Z886 Allergy status to analgesic agent status: Secondary | ICD-10-CM

## 2016-07-19 DIAGNOSIS — J111 Influenza due to unidentified influenza virus with other respiratory manifestations: Secondary | ICD-10-CM | POA: Diagnosis present

## 2016-07-19 DIAGNOSIS — R945 Abnormal results of liver function studies: Secondary | ICD-10-CM | POA: Diagnosis present

## 2016-07-19 DIAGNOSIS — E119 Type 2 diabetes mellitus without complications: Secondary | ICD-10-CM

## 2016-07-19 DIAGNOSIS — K869 Disease of pancreas, unspecified: Secondary | ICD-10-CM

## 2016-07-19 DIAGNOSIS — Z7984 Long term (current) use of oral hypoglycemic drugs: Secondary | ICD-10-CM | POA: Diagnosis not present

## 2016-07-19 DIAGNOSIS — R531 Weakness: Secondary | ICD-10-CM | POA: Diagnosis not present

## 2016-07-19 DIAGNOSIS — K2270 Barrett's esophagus without dysplasia: Secondary | ICD-10-CM | POA: Diagnosis present

## 2016-07-19 DIAGNOSIS — Z87891 Personal history of nicotine dependence: Secondary | ICD-10-CM

## 2016-07-19 DIAGNOSIS — E162 Hypoglycemia, unspecified: Secondary | ICD-10-CM | POA: Diagnosis present

## 2016-07-19 DIAGNOSIS — T380X5A Adverse effect of glucocorticoids and synthetic analogues, initial encounter: Secondary | ICD-10-CM | POA: Diagnosis present

## 2016-07-19 DIAGNOSIS — K210 Gastro-esophageal reflux disease with esophagitis: Secondary | ICD-10-CM

## 2016-07-19 DIAGNOSIS — Z885 Allergy status to narcotic agent status: Secondary | ICD-10-CM

## 2016-07-19 DIAGNOSIS — R6889 Other general symptoms and signs: Secondary | ICD-10-CM | POA: Diagnosis present

## 2016-07-19 LAB — COMPREHENSIVE METABOLIC PANEL
ALBUMIN: 2.5 g/dL — AB (ref 3.5–5.0)
ALK PHOS: 318 U/L — AB (ref 38–126)
ALT: 45 U/L (ref 17–63)
AST: 85 U/L — AB (ref 15–41)
Anion gap: 10 (ref 5–15)
BUN: 12 mg/dL (ref 6–20)
CALCIUM: 8.3 mg/dL — AB (ref 8.9–10.3)
CHLORIDE: 100 mmol/L — AB (ref 101–111)
CO2: 28 mmol/L (ref 22–32)
CREATININE: 1.21 mg/dL (ref 0.61–1.24)
GFR calc Af Amer: 60 mL/min — ABNORMAL LOW (ref >60)
GFR calc non Af Amer: 52 mL/min — ABNORMAL LOW (ref >60)
GLUCOSE: 64 mg/dL — AB (ref 65–99)
Potassium: 2.8 mmol/L — ABNORMAL LOW (ref 3.5–5.1)
SODIUM: 138 mmol/L (ref 135–145)
Total Bilirubin: 2.1 mg/dL — ABNORMAL HIGH (ref 0.3–1.2)
Total Protein: 6.5 g/dL (ref 6.5–8.1)

## 2016-07-19 LAB — URINALYSIS, ROUTINE W REFLEX MICROSCOPIC
BILIRUBIN URINE: NEGATIVE
Bacteria, UA: NONE SEEN
Glucose, UA: NEGATIVE mg/dL
Hgb urine dipstick: NEGATIVE
KETONES UR: 5 mg/dL — AB
Leukocytes, UA: NEGATIVE
Nitrite: NEGATIVE
PH: 6 (ref 5.0–8.0)
Protein, ur: 100 mg/dL — AB
Specific Gravity, Urine: 1.016 (ref 1.005–1.030)

## 2016-07-19 LAB — CBC WITH DIFFERENTIAL/PLATELET
BASOS PCT: 0 %
Basophils Absolute: 0 10*3/uL (ref 0.0–0.1)
EOS ABS: 0 10*3/uL (ref 0.0–0.7)
EOS PCT: 0 %
HCT: 34.8 % — ABNORMAL LOW (ref 39.0–52.0)
HEMOGLOBIN: 12.2 g/dL — AB (ref 13.0–17.0)
Lymphocytes Relative: 21 %
Lymphs Abs: 3.9 10*3/uL (ref 0.7–4.0)
MCH: 30.7 pg (ref 26.0–34.0)
MCHC: 35.1 g/dL (ref 30.0–36.0)
MCV: 87.7 fL (ref 78.0–100.0)
MONO ABS: 1.1 10*3/uL — AB (ref 0.1–1.0)
Monocytes Relative: 6 %
NEUTROS ABS: 13.7 10*3/uL — AB (ref 1.7–7.7)
Neutrophils Relative %: 73 %
PLATELETS: 307 10*3/uL (ref 150–400)
RBC: 3.97 MIL/uL — ABNORMAL LOW (ref 4.22–5.81)
RDW: 14.5 % (ref 11.5–15.5)
WBC: 18.7 10*3/uL — ABNORMAL HIGH (ref 4.0–10.5)

## 2016-07-19 LAB — BRAIN NATRIURETIC PEPTIDE: B NATRIURETIC PEPTIDE 5: 615.7 pg/mL — AB (ref 0.0–100.0)

## 2016-07-19 LAB — I-STAT CG4 LACTIC ACID, ED
LACTIC ACID, VENOUS: 1.8 mmol/L (ref 0.5–1.9)
LACTIC ACID, VENOUS: 1.47 mmol/L (ref 0.5–1.9)

## 2016-07-19 LAB — I-STAT TROPONIN, ED: Troponin i, poc: 0.03 ng/mL (ref 0.00–0.08)

## 2016-07-19 LAB — LIPASE, BLOOD: LIPASE: 21 U/L (ref 11–51)

## 2016-07-19 MED ORDER — ASPIRIN EC 81 MG PO TBEC
81.0000 mg | DELAYED_RELEASE_TABLET | Freq: Every day | ORAL | Status: DC
  Administered 2016-07-20 – 2016-07-23 (×4): 81 mg via ORAL
  Filled 2016-07-19 (×4): qty 1

## 2016-07-19 MED ORDER — VITAMIN D 1000 UNITS PO TABS
1000.0000 [IU] | ORAL_TABLET | Freq: Every day | ORAL | Status: DC
  Administered 2016-07-20 – 2016-07-23 (×4): 1000 [IU] via ORAL
  Filled 2016-07-19 (×4): qty 1

## 2016-07-19 MED ORDER — OSELTAMIVIR PHOSPHATE 30 MG PO CAPS
30.0000 mg | ORAL_CAPSULE | Freq: Once | ORAL | Status: AC
  Administered 2016-07-19: 30 mg via ORAL
  Filled 2016-07-19: qty 1

## 2016-07-19 MED ORDER — LEVOFLOXACIN IN D5W 500 MG/100ML IV SOLN: 500.0000 mg | INTRAVENOUS | Status: DC

## 2016-07-19 MED ORDER — ATENOLOL 50 MG PO TABS
50.0000 mg | ORAL_TABLET | Freq: Every day | ORAL | Status: DC
  Administered 2016-07-20 – 2016-07-23 (×4): 50 mg via ORAL
  Filled 2016-07-19 (×4): qty 1

## 2016-07-19 MED ORDER — INSULIN ASPART 100 UNIT/ML ~~LOC~~ SOLN: 0.0000 [IU] | Freq: Three times a day (TID) | SUBCUTANEOUS | Status: DC

## 2016-07-19 MED ORDER — FISH OIL 600 MG PO CAPS: 600.0000 mg | ORAL_CAPSULE | Freq: Every day | ORAL | Status: DC

## 2016-07-19 MED ORDER — POTASSIUM CHLORIDE 2 MEQ/ML IV SOLN
Freq: Once | INTRAVENOUS | Status: AC
  Administered 2016-07-19: 20:00:00 via INTRAVENOUS
  Filled 2016-07-19: qty 1000

## 2016-07-19 MED ORDER — DEXTROSE 50 % IV SOLN
50.0000 mL | Freq: Once | INTRAVENOUS | Status: AC
  Administered 2016-07-19: 50 mL via INTRAVENOUS
  Filled 2016-07-19: qty 50

## 2016-07-19 MED ORDER — ENOXAPARIN SODIUM 40 MG/0.4ML ~~LOC~~ SOLN
40.0000 mg | SUBCUTANEOUS | Status: DC
  Administered 2016-07-21 – 2016-07-23 (×3): 40 mg via SUBCUTANEOUS
  Filled 2016-07-19 (×4): qty 0.4

## 2016-07-19 MED ORDER — ADULT MULTIVITAMIN W/MINERALS CH
1.0000 | ORAL_TABLET | Freq: Every day | ORAL | Status: DC
  Administered 2016-07-20 – 2016-07-23 (×4): 1 via ORAL
  Filled 2016-07-19 (×4): qty 1

## 2016-07-19 MED ORDER — ALBUTEROL SULFATE (2.5 MG/3ML) 0.083% IN NEBU: 2.5000 mg | INHALATION_SOLUTION | RESPIRATORY_TRACT | Status: DC | PRN

## 2016-07-19 MED ORDER — INSULIN ASPART 100 UNIT/ML ~~LOC~~ SOLN: 0.0000 [IU] | Freq: Every day | SUBCUTANEOUS | Status: DC

## 2016-07-19 MED ORDER — MAGNESIUM SULFATE 2 GM/50ML IV SOLN
2.0000 g | Freq: Once | INTRAVENOUS | Status: AC
  Administered 2016-07-20: 2 g via INTRAVENOUS
  Filled 2016-07-19: qty 50

## 2016-07-19 MED ORDER — LEVOFLOXACIN IN D5W 500 MG/100ML IV SOLN
500.0000 mg | Freq: Once | INTRAVENOUS | Status: AC
  Administered 2016-07-19: 500 mg via INTRAVENOUS
  Filled 2016-07-19: qty 100

## 2016-07-19 MED ORDER — ACETAMINOPHEN 325 MG PO TABS: 650.0000 mg | ORAL_TABLET | Freq: Every day | ORAL | Status: DC | PRN

## 2016-07-19 MED ORDER — PANTOPRAZOLE SODIUM 40 MG PO TBEC
40.0000 mg | DELAYED_RELEASE_TABLET | Freq: Every day | ORAL | Status: DC
  Administered 2016-07-20 – 2016-07-23 (×4): 40 mg via ORAL
  Filled 2016-07-19 (×4): qty 1

## 2016-07-19 NOTE — ED Provider Notes (Signed)
Rick DEPT Provider Note   CSN: [id number] Arrival date & time: 07/19/16  1541     History   Chief Complaint Chief Complaint  Patient presents with  . Rick [name]  . [name]    HPI ANTARES LOY is a 83 y.o. male.  HPI Patient presents with one week [name] increasing generalized [name], Rick [name], Rick [name] breath or chest pain. No new lower extremity swelling or pain. Family states patient has had increased weight loss over last few weeks. Past Medical History:  Diagnosis Date  . Cancer ([place])    skin  . Diabetes ([place])    type 2  . Fibrotic lung diseases ([place])   . Hypertension   . Osteoarthritis   . Presbycusis [name] both ears   . Skin cancer   . Tumors    pancrease "it's been several years," per wife    Patient Active Problem List   Diagnosis Date Noted  . Normocytic anemia 07/19/2016  . Chronic diastolic CHF (congestive heart failure) ([place]) 07/19/2016  . Diabetes mellitus type II, non insulin dependent ([place]) 07/19/2016  . Hypoglycemia 07/19/2016  . Hypokalemia 07/19/2016  . Abnormal LFTs (liver function tests) 07/19/2016  . Influenza-like illness 07/19/2016  . Lung fibrosis ([place]) 10/25/2015  . GERD 03/20/2010  . Rick [name] 12/01/2009  . Hypertension 11/08/2009  . PULMONARY FIBROSIS 11/08/2009    Past Surgical History:  Procedure Laterality Date  . APPENDECTOMY    . ERCP N/A 06/13/2016   Procedure: ENDOSCOPIC RETROGRADE CHOLANGIOPANCREATOGRAPHY (ERCP);  Surgeon: Clarene Essex, MD;  Location: [facility];  Service: Endoscopy;  Laterality: N/A;  . NECK SURGERY    . ROTATOR CUFF REPAIR  1986       Home Medications    Prior to Admission medications   Medication Sig Start Date End Date Taking? Authorizing Provider  aspirin EC 81 MG tablet Take 1 tablet (81 mg total) by mouth daily. 10/24/15  Yes Praveen Mannam, MD  atenolol (TENORMIN) 50 MG tablet Take 50 mg by mouth daily.  05/14/16  Yes Historical Provider, MD  Cholecalciferol (VITAMIN D3) 1000 units CAPS Take 1,000 Units by mouth daily.  10/24/15  Yes Praveen Mannam, MD  Chromium 1000 MCG TABS Take 1 tablet by mouth daily.  10/24/15  Yes Praveen Mannam, MD  Cinnamon 500 MG capsule Take 500 mg by mouth daily.  10/24/15  Yes Praveen Mannam, MD  Flaxseed, Linseed, (FLAXSEED OIL) 1000 MG CAPS Take 1,000 mg by mouth daily.  10/24/15  Yes Praveen Mannam, MD  glimepiride (AMARYL) 4 MG tablet Take 4 mg by mouth daily before breakfast. 05/14/16  Yes Historical Provider, MD  Multiple Vitamins-Minerals (MULTIVITAMIN) tablet Take 1 tablet by mouth daily. 10/24/15  Yes Praveen Mannam, MD  Omega-3 Fatty Acids (FISH OIL) 600 MG CAPS Take 600 mg by mouth daily.  10/24/15  Yes Praveen Mannam, MD  Omeprazole 20 MG TBEC Take 20 mg by mouth daily.  10/24/15  Yes Praveen Mannam, MD  acetaminophen (TYLENOL) 325 MG tablet Take 650 mg by mouth daily as needed for moderate pain or headache.    Historical Provider, MD    Family History Family History  Problem Relation Age [name] Onset  . Tuberculosis Father   . Other Father     Cerebral hemmorhage  . Breast cancer Sister     Social History Social History  Substance Use Topics  . Smoking status: Former Smoker    Packs/day: 0.25  Types: Cigarettes    Quit date: 10/25/1951  . Smokeless tobacco: Never Used  . Alcohol use No     Allergies   Cephalexin; Codeine; Metformin and related; Rofecoxib; Tramadol; Tussionex pennkinetic er [employer] er]; Erythromycin; and Neomycin   Review [name] Systems Review [name] Systems  Constitutional: Positive for activity change, appetite change, chills, fatigue and Rick.  HENT: Negative for facial swelling and trouble swallowing.   Respiratory: Positive for Rick [name]. Negative for shortness [name] breath.   Cardiovascular: Negative for chest pain, palpitations and leg swelling.  Gastrointestinal: Negative for abdominal pain, constipation, diarrhea, nausea  and vomiting.  Genitourinary: Negative for dysuria, flank pain and frequency.  Musculoskeletal: Negative for back pain, myalgias, neck pain and neck stiffness.  Skin: Negative for rash and wound.  Neurological: Positive for [name] (generalized). Negative for dizziness, light-headedness, numbness and headaches.     Physical Exam Updated Vital Signs BP 165/84   Pulse 66   Temp 99.5 F (37.5 C) (Oral)   Resp 18   Ht 5\' 7"  (1.702 m)   Wt 120 lb (54.4 kg)   SpO2 97%   BMI 18.79 kg/m   Physical Exam  Constitutional: He is oriented to person, place, and time. He appears well-developed and well-nourished. No distress.  Wasted  HENT:  Head: Normocephalic and atraumatic.  Mouth/Throat: Oropharynx is clear and moist. No oropharyngeal exudate.  Mild sublingual jaundice  Eyes: EOM are normal. Pupils are equal, round, and reactive to light.  Neck: Normal range [name] motion. Neck supple. No JVD present.  Cardiovascular: Normal rate and regular rhythm.  Exam reveals no gallop and no friction rub.   No murmur heard. Pulmonary/Chest: Effort normal and breath sounds normal.  Crackles in bilateral bases  Abdominal: Soft. Bowel sounds are normal. There is no tenderness. There is no rebound and no guarding.  Musculoskeletal: Normal range [name] motion. He exhibits no edema or tenderness.  No lower extremity swelling, asymmetry or tenderness.  Neurological: He is alert and oriented to person, place, and time.  5/5 motor in all extremities. Sensation intact.  Skin: Skin is warm and dry. Capillary refill takes less than 2 seconds. No rash noted. No erythema.  Psychiatric: He has a normal mood and affect. His behavior is normal.  Nursing note and vitals reviewed.    ED Treatments / Results  Labs (all labs ordered are listed, but only abnormal results are displayed) Labs Reviewed  COMPREHENSIVE METABOLIC PANEL - Abnormal; Notable for the following:       Result Value   Potassium 2.8 (*)     Chloride 100 (*)    Glucose, Bld 64 (*)    Calcium 8.3 (*)    Albumin 2.5 (*)    AST 85 (*)    Alkaline Phosphatase 318 (*)    Total Bilirubin 2.1 (*)    GFR calc non Af Amer 52 (*)    GFR calc Af Amer 60 (*)    All other components within normal limits  CBC WITH DIFFERENTIAL/PLATELET - Abnormal; Notable for the following:    WBC 18.7 (*)    RBC 3.97 (*)    Hemoglobin 12.2 (*)    HCT 34.8 (*)    Neutro Abs 13.7 (*)    Monocytes Absolute 1.1 (*)    All other components within normal limits  URINALYSIS, ROUTINE W REFLEX MICROSCOPIC - Abnormal; Notable for the following:    Ketones, ur 5 (*)    Protein, ur 100 (*)    Squamous  Epithelial / LPF 0-5 (*)    All other components within normal limits  BRAIN NATRIURETIC PEPTIDE - Abnormal; Notable for the following:    B Natriuretic Peptide 615.7 (*)    All other components within normal limits  CULTURE, BLOOD (ROUTINE X 2)  CULTURE, BLOOD (ROUTINE X 2)  CULTURE, EXPECTORATED SPUTUM-ASSESSMENT  GRAM STAIN  LIPASE, BLOOD  INFLUENZA PANEL BY PCR (TYPE A & B)  STREP PNEUMONIAE URINARY ANTIGEN  MAGNESIUM  COMPREHENSIVE METABOLIC PANEL  HEMOGLOBIN A1C  I-STAT CG4 LACTIC ACID, ED  I-STAT CG4 LACTIC ACID, ED  I-STAT TROPOININ, ED    EKG  EKG Interpretation  Date/Time:  Thursday July 19 2016 19:34:50 EST Ventricular Rate:  85 PR Interval:    QRS Duration: 89 QT Interval:  414 QTC Calculation: 431 R Axis:   -19 Text Interpretation:  Sinus rhythm Multiple premature complexes, vent & supraven Left ventricular hypertrophy Borderline T abnormalities, inferior leads Confirmed by Lita Mains  MD, Rahsaan Weakland ([id number]) on 07/19/2016 8:57:30 PM       Radiology Dg Chest 2 View  Result Date: 07/19/2016 CLINICAL DATA:  Rick [name], Rick and [name]. EXAM: CHEST  2 VIEW COMPARISON:  07/19/2016. FINDINGS: Normal sized heart. Stable prominence [name] the interstitial markings without superimposed airspace opacity. Diffuse osteopenia. Bony remodeling [name] the  right shoulder compatible with a large, chronic rotator cuff tear. Aortic calcifications and tortuosity. IMPRESSION: Stable chronic interstitial lung disease.  No acute abnormality. Electronically Signed   By: Claudie Revering M.D.   On: 07/19/2016 20:49   Dg Chest 2 View  Result Date: 07/19/2016 CLINICAL DATA:  Rick [name], chest congestion, Rick EXAM: CHEST  2 VIEW COMPARISON:  CT chest [name] 09/15/2015 and chest x-ray [name] 07/26/2014 FINDINGS: Coarse prominent interstitial lung markings are noted diffusely primarily at the lung bases left-greater-than-right. Correlate with the prior CT chest, these findings are most consistent with pulmonary fibrosis. Superimposed pneumonia particular at the left lung base cannot be excluded. No effusion is seen. Mediastinal and hilar contours are unremarkable. The heart is within upper limits [name] normal. No bony abnormality is seen. Common bile duct stent is noted. IMPRESSION: 1. Coarse prominent markings primarily at the lung bases left-greater-than-right most consistent with pulmonary fibrosis. Difficult to exclude pneumonia particular at the left lung base. 2. Stable mild cardiomegaly. 3. Common bile duct stent. Electronically Signed   By: Ivar Drape M.D.   On: 07/19/2016 14:35    Procedures Procedures (including critical care time)  Medications Ordered in ED Medications  levofloxacin (LEVAQUIN) IVPB 500 mg (500 mg Intravenous New Bag/Given 07/19/16 2341)  acetaminophen (TYLENOL) tablet 650 mg (not administered)  atenolol (TENORMIN) tablet 50 mg (not administered)  aspirin EC tablet 81 mg (not administered)  cholecalciferol (VITAMIN D) tablet 1,000 Units (not administered)  multivitamin with minerals tablet 1 tablet (not administered)  pantoprazole (PROTONIX) EC tablet 40 mg (not administered)  enoxaparin (LOVENOX) injection 40 mg (not administered)  insulin aspart (novoLOG) injection 0-9 Units (not administered)  insulin aspart (novoLOG) injection 0-5 Units (not  administered)  albuterol (PROVENTIL) (2.5 MG/3ML) 0.083% nebulizer solution 2.5 mg (not administered)  magnesium sulfate IVPB 2 g 50 mL (not administered)  levofloxacin (LEVAQUIN) IVPB 500 mg (not administered)  sodium chloride 0.9 % 1,000 mL with potassium chloride 80 mEq infusion (not administered)  dextrose 50 % solution 50 mL (50 mLs Intravenous Given 07/19/16 1951)  sodium chloride 0.9 % 1,000 mL with potassium chloride 80 mEq infusion ( Intravenous Given 07/19/16 2020)  oseltamivir (TAMIFLU) capsule 30 mg (  30 mg Oral Given 07/19/16 2339)     Initial Impression / Assessment and Plan / ED Course  I have reviewed the triage vital signs and the nursing notes.  Pertinent labs & imaging results that were available during my care [name] the patient were reviewed by me and considered in my medical decision making (see chart for details).    Issue with generalized [name] and febrile illness with URI symptoms. Questional early pneumonia versus flu. Started on Tamiflu and antibiotics. Discussed with hospitalist and will admit.  Final Clinical Impressions(s) / ED Diagnoses   Final diagnoses:  Generalized [name]  Hypokalemia  Flu-like symptoms    New Prescriptions New Prescriptions   No medications on file     Julianne Rice, MD 07/20/16 0004

## 2016-07-19 NOTE — ED Notes (Signed)
Patient transported to X-ray 

## 2016-07-19 NOTE — ED Triage Notes (Signed)
Per Pt and family, Pt is coming from PCP with complaints of fever, cough, and weakness for the past week. Family reports that patient has been sleeping often and not had much energy.

## 2016-07-20 ENCOUNTER — Inpatient Hospital Stay (HOSPITAL_COMMUNITY)
Admit: 2016-07-20 | Discharge: 2016-07-20 | Disposition: A | Discharge status: Home / Self Care | Payer: Medicare Other | Attending: Family Medicine | Admitting: Family Medicine

## 2016-07-20 DIAGNOSIS — E162 Hypoglycemia, unspecified: Secondary | ICD-10-CM

## 2016-07-20 DIAGNOSIS — R69 Illness, unspecified: Secondary | ICD-10-CM

## 2016-07-20 DIAGNOSIS — R531 Weakness: Secondary | ICD-10-CM | POA: Insufficient documentation

## 2016-07-20 DIAGNOSIS — I5032 Chronic diastolic (congestive) heart failure: Secondary | ICD-10-CM

## 2016-07-20 DIAGNOSIS — I10 Essential (primary) hypertension: Secondary | ICD-10-CM

## 2016-07-20 DIAGNOSIS — R7989 Other specified abnormal findings of blood chemistry: Secondary | ICD-10-CM

## 2016-07-20 LAB — COMPREHENSIVE METABOLIC PANEL
ALBUMIN: 1.9 g/dL — AB (ref 3.5–5.0)
ALT: 37 U/L (ref 17–63)
ANION GAP: 8 (ref 5–15)
AST: 71 U/L — ABNORMAL HIGH (ref 15–41)
Alkaline Phosphatase: 260 U/L — ABNORMAL HIGH (ref 38–126)
BILIRUBIN TOTAL: 1.7 mg/dL — AB (ref 0.3–1.2)
BUN: 11 mg/dL (ref 6–20)
CALCIUM: 7.8 mg/dL — AB (ref 8.9–10.3)
CO2: 27 mmol/L (ref 22–32)
Chloride: 102 mmol/L (ref 101–111)
Creatinine, Ser: 0.99 mg/dL (ref 0.61–1.24)
Glucose, Bld: 106 mg/dL — ABNORMAL HIGH (ref 65–99)
POTASSIUM: 4 mmol/L (ref 3.5–5.1)
Sodium: 137 mmol/L (ref 135–145)
Total Protein: 5.8 g/dL — ABNORMAL LOW (ref 6.5–8.1)

## 2016-07-20 LAB — GLUCOSE, CAPILLARY
GLUCOSE-CAPILLARY: 191 mg/dL — AB (ref 65–99)
GLUCOSE-CAPILLARY: 193 mg/dL — AB (ref 65–99)

## 2016-07-20 LAB — CBG MONITORING, ED
GLUCOSE-CAPILLARY: 109 mg/dL — AB (ref 65–99)
GLUCOSE-CAPILLARY: 65 mg/dL (ref 65–99)
GLUCOSE-CAPILLARY: 118 mg/dL — AB (ref 65–99)
Glucose-Capillary: 59 mg/dL — ABNORMAL LOW (ref 65–99)

## 2016-07-20 LAB — INFLUENZA PANEL BY PCR (TYPE A & B)
Influenza A By PCR: NEGATIVE
Influenza B By PCR: NEGATIVE

## 2016-07-20 LAB — MAGNESIUM: Magnesium: 1.7 mg/dL (ref 1.7–2.4)

## 2016-07-20 LAB — STREP PNEUMONIAE URINARY ANTIGEN: STREP PNEUMO URINARY ANTIGEN: NEGATIVE

## 2016-07-20 MED ORDER — ENSURE ENLIVE PO LIQD: 237.0000 mL | Freq: Two times a day (BID) | ORAL | Status: DC

## 2016-07-20 MED ORDER — LEVOFLOXACIN 500 MG PO TABS
500.0000 mg | ORAL_TABLET | Freq: Every day | ORAL | Status: DC
  Administered 2016-07-20 – 2016-07-22 (×3): 500 mg via ORAL
  Filled 2016-07-20 (×3): qty 1

## 2016-07-20 MED ORDER — SODIUM CHLORIDE 0.9 % IV SOLN
Freq: Once | INTRAVENOUS | Status: AC
  Administered 2016-07-20: 04:00:00 via INTRAVENOUS
  Filled 2016-07-20: qty 1000

## 2016-07-20 NOTE — ED Notes (Signed)
NT notified to transport pt off the floor

## 2016-07-20 NOTE — Progress Notes (Signed)
Pt arrived at the unit at 1455.

## 2016-07-20 NOTE — Progress Notes (Signed)
Pt blood sugar was 191 at 1701. MD notified.

## 2016-07-20 NOTE — Progress Notes (Signed)
*  PRELIMINARY RESULTS* Vascular Ultrasound Left lower extremity venous duplex has been completed.  Preliminary findings: left lower extremity is negative for deep vein thrombosis in the visualized veins.  Small bakers cyst noted in left popliteal fossa and mild edema at the ankle.  Rick [name] 07/20/2016, 9:54 AM

## 2016-07-20 NOTE — H&P (Signed)
History and Physical    HARKIRAT OROZCO [id number] DOB: December 31, 1925 DOA: 07/19/2016  PCP: Mathews Argyle, MD   Patient coming from: Home  Chief Complaint: Fevers, cough, malaise, gen weakness   HPI: Rick [name] is a 83 y.o. 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 Patient reports that he and his usual state of health until approximately one week ago when he noted the insidious development of a nonproductive cough and general malaise. Over the ensuing days, he has developed fever with shaking chills and generalized weakness. Patient has been essentially bedbound for the last couple days secondary to his weakness and malaise. He denies any significant dyspnea but notes a persistent cough which is only occasionally productive of scant thick sputum. Patient denies any significant abdominal pain, nausea, vomiting, or diarrhea. He denies any headaches, change in vision or hearing, or focal numbness or weakness. He has not attempted any interventions for his symptoms prior to coming in.   ED Course: Upon arrival to the ED, patient is found to have temp of 37.5 C, saturating well on room air, and with vitals otherwise stable. EKG features a sinus rhythm with PVCs and inferior T-wave abnormality. Chest x-ray is notable for stable chronic interstitial lung disease. Chemistry panels notable for potassium of 2.8 and glucose of 64. LFTs are abnormal. CBC features a leukocytosis to 18,700 and a mild normocytic anemia with hemoglobin 12.2. Lactic acid is reassuring at 1.47, troponin is within normal limits, urinalysis is unremarkable, and BNP is elevated to a value of 616. Blood cultures were obtained and patient was treated with 1 ampule 50% dextrose, Tamiflu,  Levaquin, and IV fluids with potassium. He has remained hemodynamically stable in the ED and not in any significant respiratory distress. He will be observed on the telemetry unit for ongoing evaluation and management of an influenza-like illness with hypokalemia.  Review of Systems:  All other systems reviewed and apart from HPI, are negative.  Past Medical History:  Diagnosis Date  . Cancer ([place])    skin  . Diabetes ([place])    type 2  . Fibrotic lung diseases ([place])   . Hypertension   . Osteoarthritis   . Presbycusis of both ears   . Skin cancer   . Tumors    pancrease "it's been several years," per wife    Past Surgical History:  Procedure Laterality Date  . APPENDECTOMY    . ERCP N/A 06/13/2016   Procedure: ENDOSCOPIC RETROGRADE CHOLANGIOPANCREATOGRAPHY (ERCP);  Surgeon: Clarene Essex, MD;  Location: [facility];  Service: Endoscopy;  Laterality: N/A;  . NECK SURGERY    . [place]     reports that he quit smoking about 64 years ago. His smoking use included Cigarettes. He smoked 0.25 packs per day. He has never used smokeless tobacco. He reports that he does not drink alcohol or use drugs.  Allergies  Allergen Reactions  . Cephalexin Itching  . Codeine Itching  . Metformin And Related     Weight loss  . Rofecoxib     unknown  . Tramadol Itching  . Tussionex Pennkinetic Er [Hydrocod Polst-Cpm Polst Er] Itching  . Erythromycin Rash  . Neomycin Rash    Family History  Problem Relation Age of Onset  . Tuberculosis Father   . Other Father  Cerebral hemmorhage  . Breast cancer Sister      Prior to Admission medications   Medication Sig Start Date End Date Taking? Authorizing Provider  aspirin EC 81 MG tablet Take 1 tablet (81 mg total) by mouth daily. 10/24/15  Yes Praveen Mannam, MD  atenolol (TENORMIN) 50 MG tablet Take 50 mg by mouth daily. 05/14/16  Yes Historical Provider, MD  Cholecalciferol (VITAMIN D3) 1000 units CAPS Take 1,000 Units by  mouth daily.  10/24/15  Yes Praveen Mannam, MD  Chromium 1000 MCG TABS Take 1 tablet by mouth daily.  10/24/15  Yes Praveen Mannam, MD  Cinnamon 500 MG capsule Take 500 mg by mouth daily.  10/24/15  Yes Praveen Mannam, MD  Flaxseed, Linseed, (FLAXSEED OIL) 1000 MG CAPS Take 1,000 mg by mouth daily.  10/24/15  Yes Praveen Mannam, MD  glimepiride (AMARYL) 4 MG tablet Take 4 mg by mouth daily before breakfast. 05/14/16  Yes Historical Provider, MD  Multiple Vitamins-Minerals (MULTIVITAMIN) tablet Take 1 tablet by mouth daily. 10/24/15  Yes Praveen Mannam, MD  Omega-3 Fatty Acids (FISH OIL) 600 MG CAPS Take 600 mg by mouth daily.  10/24/15  Yes Praveen Mannam, MD  Omeprazole 20 MG TBEC Take 20 mg by mouth daily.  10/24/15  Yes Praveen Mannam, MD  acetaminophen (TYLENOL) 325 MG tablet Take 650 mg by mouth daily as needed for moderate pain or headache.    Historical Provider, MD    Physical Exam: Vitals:   07/19/16 2115 07/19/16 2130 07/19/16 2145 07/19/16 2200  BP: 191/67 162/75 170/75 165/84  Pulse: (!) 126 (!) 59 63 66  Resp: 25 (!) _0 Temp:      TempSrc:      SpO2: 97% 99% 99% 97%  Weight:      Height:          Constitutional: No acute respiratory distress. Appears uncomfortable. Coughing.  Eyes: PERTLA, lids and conjunctivae normal ENMT: Mucous membranes are moist. Posterior pharynx clear of any exudate or lesions.   Neck: normal, supple, no masses, no thyromegaly Respiratory: Fine crackels throughout and coarse rhonchi at right base, no wheeze. Normal respiratory effort.   Cardiovascular: S1 & S2 heard, regular rate and rhythm. 2+ pretibial edema on left, trace pretibial edema on right. No significant JVD. Abdomen: No distension, no tenderness, no masses palpated. Bowel sounds normal.  Musculoskeletal: no clubbing / cyanosis. No joint deformity upper and lower extremities. Normal muscle tone.  Skin: no significant rashes, lesions, ulcers. Warm, dry, well-perfused. Neurologic: CN 2-12  grossly intact. Sensation intact, DTR normal. Strength 5/5 in all 4 limbs.  Psychiatric: Normal judgment and insight. Alert and oriented x 3. Normal mood and affect.     Labs on Admission: I have personally reviewed following labs and imaging studies  CBC:  Recent Labs Lab 07/19/16 1610  WBC 18.7*  NEUTROABS 13.7*  HGB 12.2*  HCT 34.8*  MCV 87.7  PLT 433   Basic Metabolic Panel:  Recent Labs Lab 07/19/16 1610  NA 138  K 2.8*  CL 100*  CO2 28  GLUCOSE 64*  BUN 12  CREATININE 1.21  CALCIUM 8.3*   GFR: Estimated Creatinine Clearance: 32.5 mL/min (by C-G formula based on SCr of 1.21 mg/dL). Liver Function Tests:  Recent Labs Lab 07/19/16 1610  AST 85*  ALT 45  ALKPHOS 318*  BILITOT 2.1*  PROT 6.5  ALBUMIN 2.5*    Recent Labs Lab 07/19/16 1951  LIPASE 21   No results for input(s):  AMMONIA in the last 168 hours. Coagulation Profile: No results for input(s): INR, PROTIME in the last 168 hours. Cardiac Enzymes: No results for input(s): CKTOTAL, CKMB, CKMBINDEX, TROPONINI in the last 168 hours. BNP (last 3 results) No results for input(s): PROBNP in the last 8760 hours. HbA1C: No results for input(s): HGBA1C in the last 72 hours. CBG: No results for input(s): GLUCAP in the last 168 hours. Lipid Profile: No results for input(s): CHOL, HDL, LDLCALC, TRIG, CHOLHDL, LDLDIRECT in the last 72 hours. Thyroid Function Tests: No results for input(s): TSH, T4TOTAL, FREET4, T3FREE, THYROIDAB in the last 72 hours. Anemia Panel: No results for input(s): VITAMINB12, FOLATE, FERRITIN, TIBC, IRON, RETICCTPCT in the last 72 hours. Urine analysis:    Component Value Date/Time   COLORURINE YELLOW 07/19/2016 1949   APPEARANCEUR CLEAR 07/19/2016 1949   LABSPEC 1.016 07/19/2016 1949   PHURINE 6.0 07/19/2016 1949   GLUCOSEU NEGATIVE 07/19/2016 1949   HGBUR NEGATIVE 07/19/2016 1949   BILIRUBINUR NEGATIVE 07/19/2016 1949   KETONESUR 5 (A) 07/19/2016 1949   PROTEINUR 100  (A) 07/19/2016 1949   NITRITE NEGATIVE 07/19/2016 1949   LEUKOCYTESUR NEGATIVE 07/19/2016 1949   Sepsis Labs: _0 (procalcitonin:4,lacticidven:4) )No results found for this or any previous visit (from the past 240 hour(s)).   Radiological Exams on Admission: Dg Chest 2 View  Result Date: 07/19/2016 CLINICAL DATA:  Cough, fever and weakness. EXAM: CHEST  2 VIEW COMPARISON:  07/19/2016. FINDINGS: Normal sized heart. Stable prominence of the interstitial markings without superimposed airspace opacity. Diffuse osteopenia. Bony remodeling of the right shoulder compatible with a large, chronic rotator cuff tear. Aortic calcifications and tortuosity. IMPRESSION: Stable chronic interstitial lung disease.  No acute abnormality. Electronically Signed   By: Claudie Revering M.D.   On: 07/19/2016 20:49   Dg Chest 2 View  Result Date: 07/19/2016 CLINICAL DATA:  Cough, chest congestion, fever EXAM: CHEST  2 VIEW COMPARISON:  CT chest of 09/15/2015 and chest x-ray of 07/26/2014 FINDINGS: Coarse prominent interstitial lung markings are noted diffusely primarily at the lung bases left-greater-than-right. Correlate with the prior CT chest, these findings are most consistent with pulmonary fibrosis. Superimposed pneumonia particular at the left lung base cannot be excluded. No effusion is seen. Mediastinal and hilar contours are unremarkable. The heart is within upper limits of normal. No bony abnormality is seen. Common bile duct stent is noted. IMPRESSION: 1. Coarse prominent markings primarily at the lung bases left-greater-than-right most consistent with pulmonary fibrosis. Difficult to exclude pneumonia particular at the left lung base. 2. Stable mild cardiomegaly. 3. Common bile duct stent. Electronically Signed   By: Ivar Drape M.D.   On: 07/19/2016 14:35    EKG: Independently reviewed. Sinus rhythm, PVC's, inferior T-wave flattening/inversion  Assessment/Plan  1. Influenza-like illness  - Pt presents  with fevers, chills, malaise, dry cough  - Influenza PCR pending; empiric Tamiflu started  - Supportive care with APAP, gentle IVF hydration  - Infection-control measures, maintain droplet precautions   2. Hypokalemia  - Serum potassium is 2.8 on admission  - He is receiving 80 mEq KCl with IVF and was treated with 2 g IV magnesium  - Monitor on telemetry  - Repeat chemistries in am    3. Hypoglycemia, type II DM  - Serum glucose 64 on admission, possibly contributing to pt's sxs; he was treated with 1 amp D-50% in ED  - There is no A1c on file - He is managed with glimepiride at home; this is held on admission and  should likely be stopped, will check A1c  - Check CBG with meals and qHS; low-intensity sliding-scale correctional available if needed    4. Chronic diastolic CHF  - Pt appears dry on admission in setting of febrile illness with poor appetite, though BNP noted to be elevated and there is some peripheral edema on admission  - TTE (07/30/14) with EF 50-55%, mild LVH, grade 1 diastolic dysfunction, mild MR  - Not requiring diuretics at home - He is being hydrated with NS on admission - Will follow I/O's and daily wts   5. Hypertension  - BP at goal on admission  - Continue atenolol as tolerated    6. GERD  - Stable, asymptomatic  - Hx of Barrett's esophagus, followed by GI - Continue daily PPI   7. Abnormal LFT's  - AST, alk phos, and bilirubin noted to be elevated  - Unable to find documentation with details, but pt appears to have biliary obstruction at level of pancreatic lesion, s/p CBD stent  - There is concern for pancreatic cancer and PCP has referred him for MRI  - He is being followed by GI in the outpatient setting  - No abd pain, nausea, or vomiting on admission    DVT prophylaxis: sq Lovenox  Code Status: Full  Family Communication: Discussed with patient Disposition Plan: Admit to telemetry Consults called: None Admission status: Inpatient      Vianne Bulls, MD [facility] Pager [phone]  If 7PM-7AM, please contact night-coverage [email or web address] Password [email or web address]  07/20/2016, 12:00 AM

## 2016-07-20 NOTE — Progress Notes (Signed)
Rick [name] is a 83 y.o. male patient admitted from ED awake, alert - oriented  X 4 - no acute distress noted.  VSS - Blood pressure (!) 143/101, pulse 70, temperature 99.1 F (37.3 C), temperature source Oral, resp. rate (!) 25, height 5\' 7"  (1.702 m), weight 55.5 kg (122 lb 4.8 oz), SpO2 98 %.    IV in place, occlusive dsg intact without redness.  Orientation to room, and floor completed with information packet given to patient/family.  Patient declined safety video at this time.  Admission INP armband ID verified with patient/family, and in place.   SR up x 2, fall assessment complete, with patient and family able to verbalize understanding of risk associated with falls, and verbalized understanding to call nsg before up out of bed.  Call light within reach, patient able to voice, and demonstrate understanding.  Skin, clean-dry- intact without evidence of bruising, or skin tears.   No evidence of skin break down noted on exam.     Will cont to eval and treat per MD orders.  Dorris Carnes, RN 07/20/2016 3:25 PM

## 2016-07-20 NOTE — ED Notes (Signed)
Pt given lunch tray.

## 2016-07-20 NOTE — Progress Notes (Signed)
[facility] PROGRESS NOTE  Rick [name] [id number] DOB: May 05, 1926 DOA: 07/19/2016  PCP: Mathews Argyle, MD  Brief History/Interval Summary: 83 year old Caucasian male with a past medical history of type 2 diabetes, pulmonary fibrosis, hypertension, history of biliary obstruction secondary to pancreatic lesion with stent, presented with one-week history of fever, cough, generalized weakness. Patient was suspected of having flulike illness. He was also noted to be hypokalemic. He was hospitalized for further management.  Reason for Visit: Flulike illness  Consultants: None  Procedures: Left lower extremity venous Doppler is negative for DVT  Antibiotics: Levaquin  Subjective/Interval History: Patient appears to be mildly distracted this morning. States that he is feeling better. Continues to have a cough with clear expectoration. Denies any chest pain, nausea, vomiting.  ROS: Denies any headaches.  Objective:  Vital Signs  Vitals:   07/20/16 0800 07/20/16 0845 07/20/16 0915 07/20/16 0930  BP: 167/68 157/71 160/73   Pulse: 66 69 77 67  Resp:    (!) 27  Temp:      TempSrc:      SpO2: 97% 98% 100% 98%  Weight:      Height:        Intake/Output Summary (Last 24 hours) at 07/20/16 1122 Last data filed at 07/20/16 0041  Gross per 24 hour  Intake              100 ml  Output                0 ml  Net              100 ml   Filed Weights   07/19/16 1602  Weight: 54.4 kg (120 lb)    General appearance: alert, cooperative, appears stated age, distracted and no distress Resp: Coarse breath sounds bilaterally. Crackles at the bases. No wheezing. No rhonchi. Cardio: regular rate and rhythm, S1, S2 normal, no murmur, click, rub or gallop GI: soft, non-tender; bowel sounds normal; no masses,  no organomegaly Extremities: extremities normal, atraumatic, no cyanosis or edema Neurologic: Awake and alert. Distracted. Mildly confused. Cranial nerves II-12  intact. Motor strength equal bilateral upper and lower extremities.  Lab Results:  Data Reviewed: I have personally reviewed following labs and imaging studies  CBC:  Recent Labs Lab 07/19/16 1610  WBC 18.7*  NEUTROABS 13.7*  HGB 12.2*  HCT 34.8*  MCV 87.7  PLT 272    Basic Metabolic Panel:  Recent Labs Lab 07/19/16 1610 07/20/16 0608  NA 138 137  K 2.8* 4.0  CL 100* 102  CO2 28 27  GLUCOSE 64* 106*  BUN 12 11  CREATININE 1.21 0.99  CALCIUM 8.3* 7.8*  MG  --  1.7    GFR: Estimated Creatinine Clearance: 39.7 mL/min (by C-G formula based on SCr of 0.99 mg/dL).  Liver Function Tests:  Recent Labs Lab 07/19/16 1610 07/20/16 0608  AST 85* 71*  ALT 45 37  ALKPHOS 318* 260*  BILITOT 2.1* 1.7*  PROT 6.5 5.8*  ALBUMIN 2.5* 1.9*     Recent Labs Lab 07/19/16 1951  LIPASE 21   CBG:  Recent Labs Lab 07/20/16 0158 07/20/16 0356 07/20/16 0810 07/20/16 0917  GLUCAP 59* 109* 65 118*     Recent Results (from the past 240 hour(s))  Culture, blood (Routine X 2) w Reflex to ID Panel     Status: None (Preliminary result)   Collection Time: 07/19/16  7:41 PM  Result Value Ref Range Status   Specimen Description BLOOD  LEFT ANTECUBITAL  Final   Special Requests BOTTLES DRAWN AEROBIC AND ANAEROBIC 5CC  Final   Culture NO GROWTH < 24 HOURS  Final   Report Status PENDING  Incomplete  Culture, blood (Routine X 2) w Reflex to ID Panel     Status: None (Preliminary result)   Collection Time: 07/19/16  7:55 PM  Result Value Ref Range Status   Specimen Description BLOOD RIGHT ANTECUBITAL  Final   Special Requests BOTTLES DRAWN AEROBIC AND ANAEROBIC 5CC  Final   Culture NO GROWTH < 24 HOURS  Final   Report Status PENDING  Incomplete      Radiology Studies: Dg Chest 2 View  Result Date: 07/19/2016 CLINICAL DATA:  Cough, fever and weakness. EXAM: CHEST  2 VIEW COMPARISON:  07/19/2016. FINDINGS: Normal sized heart. Stable prominence of the interstitial markings  without superimposed airspace opacity. Diffuse osteopenia. Bony remodeling of the right shoulder compatible with a large, chronic rotator cuff tear. Aortic calcifications and tortuosity. IMPRESSION: Stable chronic interstitial lung disease.  No acute abnormality. Electronically Signed   By: Claudie Revering M.D.   On: 07/19/2016 20:49   Dg Chest 2 View  Result Date: 07/19/2016 CLINICAL DATA:  Cough, chest congestion, fever EXAM: CHEST  2 VIEW COMPARISON:  CT chest of 09/15/2015 and chest x-ray of 07/26/2014 FINDINGS: Coarse prominent interstitial lung markings are noted diffusely primarily at the lung bases left-greater-than-right. Correlate with the prior CT chest, these findings are most consistent with pulmonary fibrosis. Superimposed pneumonia particular at the left lung base cannot be excluded. No effusion is seen. Mediastinal and hilar contours are unremarkable. The heart is within upper limits of normal. No bony abnormality is seen. Common bile duct stent is noted. IMPRESSION: 1. Coarse prominent markings primarily at the lung bases left-greater-than-right most consistent with pulmonary fibrosis. Difficult to exclude pneumonia particular at the left lung base. 2. Stable mild cardiomegaly. 3. Common bile duct stent. Electronically Signed   By: Ivar Drape M.D.   On: 07/19/2016 14:35     Medications:  Scheduled: . aspirin EC  81 mg Oral Daily  . atenolol  50 mg Oral Daily  . cholecalciferol  1,000 Units Oral Daily  . enoxaparin (LOVENOX) injection  40 mg Subcutaneous Q24H  . insulin aspart  0-5 Units Subcutaneous QHS  . insulin aspart  0-9 Units Subcutaneous TID WC  . levofloxacin  500 mg Oral QHS  . multivitamin with minerals  1 tablet Oral Daily  . pantoprazole  40 mg Oral Daily   Continuous:  GOT:[id number], albuterol  Assessment/Plan:  Principal Problem:   Influenza-like illness Active Problems:   Hypertension   PULMONARY FIBROSIS   GERD   Normocytic anemia   Chronic  diastolic CHF (congestive heart failure) (HCC)   Diabetes mellitus type II, non insulin dependent (HCC)   Hypoglycemia   Hypokalemia   Abnormal LFTs (liver function tests)    Influenza-like illness/possible acute bronchitis Influenza PCR was negative. No need to continue Tamiflu. Patient could still have acute bronchitis. I think it's prudent to continue with anti-bacterial treatment. Continue with Levaquin.  Hypokalemia  Serum potassium is 2.8 on admission. This was aggressively repleted. Normal this morning. Magnesium is 1.7.  Hypoglycemia in the setting of type II DM  Serum glucose 64 on admission, possibly contributing to pt's sxs; he was treated with 1 amp D-50% in ED. HbA1c is pending. Patient was noted to be on glimepiride at home, which has been held. Continue to monitor CBGs. We will discontinue even  sliding scale for now.  Chronic diastolic CHF  Patient appeared to be dry at the time of initial evaluation. Patient was given gentle IV hydration. Seems euvolemic now. Last echocardiogram is from 2016 which showed a EF of 50-55% with mild LVH and grade 1 diastolic dysfunction and mild MR.  Essential Hypertension  Continue to monitor blood pressures.  GERD  Stable, asymptomatic. Hx of Barrett's esophagus, followed by GI. Continue PPI.  Abnormal LFT's/pancreatic lesion/CBD stent AST, alk phos, and bilirubin noted to be elevated. Apparently there is a history of biliary obstruction at level of pancreatic lesion, s/p CBD stent. There is concern for pancreatic cancer. This is being followed closely by his PCP. Energy is symptomatic. Abdomen is benign. Monitor LFTs periodically.  DVT Prophylaxis: Lovenox    Code Status: Full code  Family Communication: No family at bedside  Disposition Plan: Management as outlined above. PT and OT evaluation.    LOS: 1 day   [facility] Pager [phone] 07/20/2016, 11:22 AM  If 7PM-7AM, please contact  night-coverage at [email or web address], password [facility]

## 2016-07-21 DIAGNOSIS — J8410 Pulmonary fibrosis, unspecified: Secondary | ICD-10-CM

## 2016-07-21 DIAGNOSIS — E43 Unspecified severe protein-calorie malnutrition: Secondary | ICD-10-CM | POA: Insufficient documentation

## 2016-07-21 LAB — CBC
HCT: 30.1 % — ABNORMAL LOW (ref 39.0–52.0)
Hemoglobin: 10.1 g/dL — ABNORMAL LOW (ref 13.0–17.0)
MCH: 29.7 pg (ref 26.0–34.0)
MCHC: 33.6 g/dL (ref 30.0–36.0)
MCV: 88.5 fL (ref 78.0–100.0)
PLATELETS: 200 10*3/uL (ref 150–400)
RBC: 3.4 MIL/uL — AB (ref 4.22–5.81)
RDW: 13.9 % (ref 11.5–15.5)
WBC: 12.1 10*3/uL — AB (ref 4.0–10.5)

## 2016-07-21 LAB — COMPREHENSIVE METABOLIC PANEL
ALBUMIN: 1.6 g/dL — AB (ref 3.5–5.0)
ALT: 31 U/L (ref 17–63)
AST: 52 U/L — AB (ref 15–41)
Alkaline Phosphatase: 239 U/L — ABNORMAL HIGH (ref 38–126)
Anion gap: 6 (ref 5–15)
BUN: 13 mg/dL (ref 6–20)
CHLORIDE: 102 mmol/L (ref 101–111)
CO2: 26 mmol/L (ref 22–32)
CREATININE: 1.06 mg/dL (ref 0.61–1.24)
Calcium: 7.6 mg/dL — ABNORMAL LOW (ref 8.9–10.3)
GFR calc Af Amer: >60 mL/min (ref >60)
GFR calc non Af Amer: >60 mL/min (ref >60)
GLUCOSE: 67 mg/dL (ref 65–99)
Potassium: 3.4 mmol/L — ABNORMAL LOW (ref 3.5–5.1)
SODIUM: 134 mmol/L — AB (ref 135–145)
Total Bilirubin: 1.4 mg/dL — ABNORMAL HIGH (ref 0.3–1.2)
Total Protein: 5 g/dL — ABNORMAL LOW (ref 6.5–8.1)

## 2016-07-21 LAB — EXPECTORATED SPUTUM ASSESSMENT W REFEX TO RESP CULTURE

## 2016-07-21 LAB — GLUCOSE, CAPILLARY
Glucose-Capillary: 68 mg/dL (ref 65–99)
Glucose-Capillary: 134 mg/dL — ABNORMAL HIGH (ref 65–99)
Glucose-Capillary: 294 mg/dL — ABNORMAL HIGH (ref 65–99)
Glucose-Capillary: 361 mg/dL — ABNORMAL HIGH (ref 65–99)

## 2016-07-21 LAB — HEMOGLOBIN A1C
HEMOGLOBIN A1C: 5.7 % — AB (ref 4.8–5.6)
Mean Plasma Glucose: 117 mg/dL

## 2016-07-21 MED ORDER — INSULIN ASPART 100 UNIT/ML ~~LOC~~ SOLN
0.0000 [IU] | Freq: Every day | SUBCUTANEOUS | Status: DC
  Administered 2016-07-21: 5 [IU] via SUBCUTANEOUS
  Administered 2016-07-22: 2 [IU] via SUBCUTANEOUS

## 2016-07-21 MED ORDER — PRO-STAT SUGAR FREE PO LIQD
30.0000 mL | Freq: Two times a day (BID) | ORAL | Status: DC
  Administered 2016-07-21 – 2016-07-23 (×5): 30 mL via ORAL
  Filled 2016-07-21 (×4): qty 30

## 2016-07-21 MED ORDER — INSULIN ASPART 100 UNIT/ML ~~LOC~~ SOLN
0.0000 [IU] | Freq: Three times a day (TID) | SUBCUTANEOUS | Status: DC
  Administered 2016-07-22: 5 [IU] via SUBCUTANEOUS
  Administered 2016-07-22: 3 [IU] via SUBCUTANEOUS
  Administered 2016-07-22: 4 [IU] via SUBCUTANEOUS
  Administered 2016-07-23: 2 [IU] via SUBCUTANEOUS
  Administered 2016-07-23: 7 [IU] via SUBCUTANEOUS

## 2016-07-21 MED ORDER — PREDNISONE 20 MG PO TABS
40.0000 mg | ORAL_TABLET | Freq: Two times a day (BID) | ORAL | Status: DC
  Administered 2016-07-21 – 2016-07-22 (×3): 40 mg via ORAL
  Filled 2016-07-21 (×3): qty 2

## 2016-07-21 MED ORDER — POTASSIUM CHLORIDE CRYS ER 20 MEQ PO TBCR
40.0000 meq | EXTENDED_RELEASE_TABLET | Freq: Once | ORAL | Status: AC
  Administered 2016-07-21: 40 meq via ORAL
  Filled 2016-07-21: qty 2

## 2016-07-21 NOTE — Progress Notes (Signed)
Patient's CBG 361 with no insulin orders.  On-call MD notified.  Will continue to monitor patient and notify as needed.

## 2016-07-21 NOTE — Progress Notes (Signed)
Initial Nutrition Assessment  DOCUMENTATION CODES:  Severe malnutrition in context of chronic illness   Pt meets criteria for SEVERE MALNUTRITION in the context of Chronic Illness as evidenced by Severe muscle/fat loss.  INTERVENTION:  Magic cup TID with meals, each supplement provides 290 kcal and 9 grams of protein cup  Will order 30 mL Prostat BID, each supplement provides 100 kcal and 15 grams of protein.  NUTRITION DIAGNOSIS:  Increased nutrient needs related to acute illness, chronic illness (pulmonary fibrosis) as evidenced by severe depletion of body fat and muscle  GOAL:  Patient will meet greater than or equal to 90% of their needs  MONITOR:  PO intake, Supplement acceptance, Labs, Weight trends  REASON FOR ASSESSMENT:  Malnutrition Screening Tool    ASSESSMENT:  83 y/o male PMHx DM2, CHF, Pulmonary fibrosis, HTN, Biliary obstruction 2/2 pancreatic lesion with stent.  Presented with 1 week of fevers, cough, weakness. Admitted for ongoing eval of influenza-like illness with hypokalemia  Pt is very HOH and could not converse well. He was preoccupied with finding remote.   He says he has been eating well. His main complaints are SOB and cough.   He did not follow any type of diet at home nor did he drink any type of nutritional beverages because "my doctor told me I couldn't drink them".   Pt says his weight fluctuates, but his current weight is within UBW range. Per chart review, wt does appear to have been stable for at least the past 8 months.   Pt is seen with 100% of meal tray eaten. He appears to have declined the Ensure. Will try Magic cup  NFPE: Displays severe muscle/fat wasting. Though chronic as weight is stable.   Medications: Vitamin D, Ensure Enlive, ABX, MVI w/ Min, prednisone, KCL.  Labs: BG variable, 70-190, albumin: 1.6, WBC: 12.1  Recent Labs Lab 07/19/16 1610 07/20/16 0608 07/21/16 0601  NA 138 137 134*  K 2.8* 4.0 3.4*  CL 100* 102 102   CO2 28 27 26   BUN 12 11 13   CREATININE 1.21 0.99 1.06  CALCIUM 8.3* 7.8* 7.6*  MG  --  1.7  --   GLUCOSE 64* 106* 67   Diet Order:  Diet regular Room service appropriate? Yes; Fluid consistency: Thin  Skin: MSAD to groin, buttocks  Last BM:  1/26  Height:  Ht Readings from Last 1 Encounters:  07/20/16 5\' 7"  (1.702 m)   Weight:  Wt Readings from Last 1 Encounters:  07/20/16 122 lb 4.8 oz (55.5 kg)   Wt Readings from Last 10 Encounters:  07/20/16 122 lb 4.8 oz (55.5 kg)  06/13/16 122 lb (55.3 kg)  10/25/15 121 lb 12.8 oz (55.2 kg)  04/04/10 161 lb (73 kg)  12/01/09 159 lb (72.1 kg)  11/08/09 156 lb (70.8 kg)   Ideal Body Weight:  67.27 kg  BMI:  Body mass index is 19.15 kg/m.  Estimated Nutritional Needs:  Kcal:  1700-1900 (31-34 kcal/kg bw) Protein:  80-90 g Pro (1.4-1.6 g/kg bw) Fluid:  1.7-1.9 L fluid  EDUCATION NEEDS:  No education needs identified at this time  Burtis Junes RD, LDN, [facility] Pager: [phone] 07/21/2016 11:23 AM

## 2016-07-21 NOTE — Progress Notes (Signed)
[facility] PROGRESS NOTE  Rick [name] OEU:[id number] DOB: [birth year] DOA: 07/19/2016  PCP: Mathews Argyle, MD  Brief History/Interval Summary: 83 year old Caucasian male with a past medical history of type 2 diabetes, pulmonary fibrosis, hypertension, history of biliary obstruction secondary to pancreatic lesion with stent, presented with one-week history of fever, cough, generalized weakness. Patient was suspected of having flulike illness. He was also noted to be hypokalemic. He was hospitalized for further management.  Reason for Visit: Flulike illness  Consultants: None  Procedures: Left lower extremity venous Doppler is negative for DVT  Antibiotics: Levaquin  Subjective/Interval History: Patient states that he is feeling better. Continues to have a cough. Somewhat distracted. Poor historian. Denies any chest pain.   ROS: Denies any nausea or vomiting.  Objective:  Vital Signs  Vitals:   07/20/16 1346 07/20/16 1355 07/20/16 2129 07/21/16 0520  BP: (!) 143/101 134/78 (!) 152/75 135/86  Pulse: 70  78 73  Resp: (!) _0 Temp: 99.1 F (37.3 C)  98.9 F (37.2 C) 98.2 F (36.8 C)  TempSrc: Oral  Oral Oral  SpO2: 98%  100% 100%  Weight: 55.5 kg (122 lb 4.8 oz)     Height: _1  (1.702 m)       Intake/Output Summary (Last 24 hours) at 07/21/16 1043 Last data filed at 07/20/16 2100  Gross per 24 hour  Intake              240 ml  Output                0 ml  Net              240 ml   Filed Weights   07/19/16 1602 07/20/16 1346  Weight: 54.4 kg (120 lb) 55.5 kg (122 lb 4.8 oz)    General appearance: alert, cooperative, appears stated age, less distracted and no distress Resp: Improved breath sound. Coarse breath sounds bilaterally. Continues to have crackles bilaterally. No wheezing. No rhonchi. Cardio: regular rate and rhythm, S1, S2 normal, no murmur, click, rub or gallop GI: soft, non-tender; bowel sounds normal; no masses,  no  organomegaly Extremities: extremities normal, atraumatic, no cyanosis or edema Neurologic: Awake and alert. Distracted. Mildly confused. Cranial nerves II-12 intact. Motor strength equal bilateral upper and lower extremities.  Lab Results:  Data Reviewed: I have personally reviewed following labs and imaging studies  CBC:  Recent Labs Lab 07/19/16 1610 07/21/16 0601  WBC 18.7* 12.1*  NEUTROABS 13.7*  --   HGB 12.2* 10.1*  HCT 34.8* 30.1*  MCV 87.7 88.5  PLT 307 154    Basic Metabolic Panel:  Recent Labs Lab 07/19/16 1610 07/20/16 0608 07/21/16 0601  NA 138 137 134*  K 2.8* 4.0 3.4*  CL 100* 102 102  CO2 _2 GLUCOSE 64* 106* 67  BUN _3 CREATININE 1.21 0.99 1.06  CALCIUM 8.3* 7.8* 7.6*  MG  --  1.7  --     GFR: Estimated Creatinine Clearance: 37.8 mL/min (by C-G formula based on SCr of 1.06 mg/dL).  Liver Function Tests:  Recent Labs Lab 07/19/16 1610 07/20/16 0608 07/21/16 0601  AST 85* 71* 52*  ALT 45 37 31  ALKPHOS 318* 260* 239*  BILITOT 2.1* 1.7* 1.4*  PROT 6.5 5.8* 5.0*  ALBUMIN 2.5* 1.9* 1.6*     Recent Labs Lab 07/19/16 1951  LIPASE 21   CBG:  Recent Labs Lab 07/20/16 0810 07/20/16 0917 07/20/16  1701 07/20/16 2138 07/21/16 0829  GLUCAP 65 118* 191* 193* 68     Recent Results (from the past 240 hour(s))  Culture, blood (Routine X 2) w Reflex to ID Panel     Status: None (Preliminary result)   Collection Time: 07/19/16  7:41 PM  Result Value Ref Range Status   Specimen Description BLOOD LEFT ANTECUBITAL  Final   Special Requests BOTTLES DRAWN AEROBIC AND ANAEROBIC 5CC  Final   Culture NO GROWTH 2 DAYS  Final   Report Status PENDING  Incomplete  Culture, blood (Routine X 2) w Reflex to ID Panel     Status: None (Preliminary result)   Collection Time: 07/19/16  7:55 PM  Result Value Ref Range Status   Specimen Description BLOOD RIGHT ANTECUBITAL  Final   Special Requests BOTTLES DRAWN AEROBIC AND ANAEROBIC 5CC   Final   Culture NO GROWTH 2 DAYS  Final   Report Status PENDING  Incomplete  Culture, sputum-assessment     Status: None (Preliminary result)   Collection Time: 07/20/16  2:23 PM  Result Value Ref Range Status   Specimen Description EXPECTORATED SPUTUM  Final   Special Requests NONE  Final   Sputum evaluation THIS SPECIMEN IS ACCEPTABLE FOR SPUTUM CULTURE  Final   Report Status PENDING  Incomplete  Culture, respiratory (NON-Expectorated)     Status: None (Preliminary result)   Collection Time: 07/20/16  2:23 PM  Result Value Ref Range Status   Specimen Description EXPECTORATED SPUTUM  Final   Special Requests NONE Reflexed from [id number]  Final   Gram Stain   Final    ABUNDANT WBC PRESENT,BOTH PMN AND MONONUCLEAR ABUNDANT GRAM NEGATIVE COCCI IN PAIRS RARE GRAM POSITIVE RODS    Culture CULTURE REINCUBATED FOR BETTER GROWTH  Final   Report Status PENDING  Incomplete      Radiology Studies: Dg Chest 2 View  Result Date: 07/19/2016 CLINICAL DATA:  Cough, fever and weakness. EXAM: CHEST  2 VIEW COMPARISON:  07/19/2016. FINDINGS: Normal sized heart. Stable prominence of the interstitial markings without superimposed airspace opacity. Diffuse osteopenia. Bony remodeling of the right shoulder compatible with a large, chronic rotator cuff tear. Aortic calcifications and tortuosity. IMPRESSION: Stable chronic interstitial lung disease.  No acute abnormality. Electronically Signed   By: Claudie Revering M.D.   On: 07/19/2016 20:49   Dg Chest 2 View  Result Date: 07/19/2016 CLINICAL DATA:  Cough, chest congestion, fever EXAM: CHEST  2 VIEW COMPARISON:  CT chest of 09/15/2015 and chest x-ray of 07/26/2014 FINDINGS: Coarse prominent interstitial lung markings are noted diffusely primarily at the lung bases left-greater-than-right. Correlate with the prior CT chest, these findings are most consistent with pulmonary fibrosis. Superimposed pneumonia particular at the left lung base cannot be excluded. No  effusion is seen. Mediastinal and hilar contours are unremarkable. The heart is within upper limits of normal. No bony abnormality is seen. Common bile duct stent is noted. IMPRESSION: 1. Coarse prominent markings primarily at the lung bases left-greater-than-right most consistent with pulmonary fibrosis. Difficult to exclude pneumonia particular at the left lung base. 2. Stable mild cardiomegaly. 3. Common bile duct stent. Electronically Signed   By: Ivar Drape M.D.   On: 07/19/2016 14:35     Medications:  Scheduled: . aspirin EC  81 mg Oral Daily  . atenolol  50 mg Oral Daily  . cholecalciferol  1,000 Units Oral Daily  . enoxaparin (LOVENOX) injection  40 mg Subcutaneous Q24H  . feeding supplement (ENSURE ENLIVE)  237 mL Oral BID BM  . levofloxacin  500 mg Oral QHS  . multivitamin with minerals  1 tablet Oral Daily  . pantoprazole  40 mg Oral Daily  . predniSONE  40 mg Oral BID WC   Continuous:  [id number], albuterol  Assessment/Plan:  Principal Problem:   Influenza-like illness Active Problems:   Hypertension   PULMONARY FIBROSIS   GERD   Normocytic anemia   Chronic diastolic CHF (congestive heart failure) (HCC)   Diabetes mellitus type II, non insulin dependent (HCC)   Hypoglycemia   Hypokalemia   Abnormal LFTs (liver function tests)    Influenza-like illness/possible acute bronchitis Patient is slowly improving. Influenza PCR was negative. No need to continue Tamiflu. Patient could still have acute bronchitis. Continue Levaquin. Patient also has a history of interstitial lung disease and hence he has crackles on examination. He will be given steroids which might also help his symptoms.  Hypokalemia  Serum potassium is 2.8 on admission. This was aggressively repleted. Give additional dose today.  Hypoglycemia in the setting of type II DM  Serum glucose 64 on admission, possibly contributing to pt's sxs; he was treated with 1 amp D-50% in ED. HbA1c is 5.7.  Patient was noted to be on glimepiride at home, which has been held. Continue to monitor CBGs. He should come off of the glimepiride.  Chronic diastolic CHF  Patient appeared to be dry at the time of initial evaluation. Patient was given gentle IV hydration. Seems euvolemic now. Last echocardiogram is from 2016 which showed a EF of 50-55% with mild LVH and grade 1 diastolic dysfunction and mild MR.  Essential Hypertension  Continue to monitor blood pressures.  GERD  Stable, asymptomatic. Hx of Barrett's esophagus, followed by GI. Continue PPI.  Abnormal LFT's/pancreatic lesion/CBD stent AST, alk phos, and bilirubin noted to be elevated. Apparently there is a history of biliary obstruction at level of pancreatic lesion, s/p CBD stent. There is concern for pancreatic cancer. This is being followed closely by his PCP. Energy is symptomatic. Abdomen is benign. Monitor LFTs periodically.  DVT Prophylaxis: Lovenox    Code Status: Full code  Family Communication: Discussed with the patient. Discussed with his wife and son as well.  Disposition Plan: Management as outlined above. Seen by physical therapy. Will need home health at discharge. Anticipate discharge tomorrow.    LOS: 2 days   [facility] Pager [phone] 07/21/2016, 10:43 AM  If 7PM-7AM, please contact night-coverage at [email or web address], password [facility]

## 2016-07-21 NOTE — Progress Notes (Signed)
Received a tele called at 2240 for a ventricular trigeminy, pt stable and no distress, stripped verified and MD made aware of event. MD return called with no orders for follow up, cont to monitor pt

## 2016-07-21 NOTE — Evaluation (Signed)
Physical Therapy Evaluation Patient Details Name: Rick [name] MRN: [id number] DOB: Apr 01, 1926 Today's Date: 07/21/2016   History of Present Illness  Rick [name] is a 83 y.o. 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  Found to have influenza-like illness with hypokalemia.  Clinical Impression  Patient presents with decreased independence with mobility due to deficits listed in PT problem list.  He will benefit from skilled PT in the acute setting to allow return home with spouse assist and follow up HHPT. Will need wife education that will need RW full time due to weakness and fall risk.  If she is unable to provide physical assist for safety may need to consider STSNF.     Follow Up Recommendations Home health PT;Supervision/Assistance - 24 hour    Equipment Recommendations  None recommended by PT    Recommendations for Other Services       Precautions / Restrictions Precautions Precautions: Fall      Mobility  Bed Mobility Overal bed mobility: Needs Assistance Bed Mobility: Supine to Sit     Supine to sit: Min assist     General bed mobility comments: for lifting trunk  Transfers Overall transfer level: Needs assistance Equipment used: Rolling walker (2 wheeled) Transfers: Stand Pivot Transfers;Sit to/from Stand Sit to Stand: Min assist Stand pivot transfers: Min assist       General transfer comment: up to Rick [name] assist for balance, cues for hand placement, pt incontinent of stool, but requesting to have BM  Ambulation/Gait Ambulation/Gait assistance: Min assist Ambulation Distance (Feet): 150 Feet Assistive device: Rolling walker (2 wheeled) Gait Pattern/deviations: Step-through pattern;Trunk flexed;Decreased stride  length     General Gait Details: assist for balance, safety, cues for posture  Stairs            Wheelchair Mobility    Modified Rankin (Stroke Patients Only)       Balance Overall balance assessment: Needs assistance Sitting-balance support: No upper extremity supported;Feet supported Sitting balance-Leahy Scale: Good     Standing balance support: Bilateral upper extremity supported Standing balance-Leahy Scale: Poor Standing balance comment: needs UE support for balance in standing due to weakness                             Pertinent Vitals/Pain Pain Assessment: No/denies pain    Home Living Family/patient expects to be discharged to:: Private residence Living Arrangements: Spouse/significant other Available Help at Discharge: Family Type of Home: House Home Access: Stairs to enter Entrance Stairs-Rails: Right Entrance Stairs-Number of Steps: 3-4 Home Layout: One level Home Equipment: Environmental consultant - 2 wheels      Prior Function Level of Independence: Independent         Comments: reports walked unaided PTA     Hand Dominance        Extremity/Trunk Assessment   Upper Extremity Assessment Upper Extremity Assessment: Overall WFL for tasks assessed    Lower Extremity Assessment Lower Extremity Assessment: Generalized weakness       Communication   Communication: HOH (has bilat hearing aides)  Cognition Arousal/Alertness: Awake/alert Behavior During Therapy: Flat affect Overall Cognitive Status: No family/caregiver present to determine baseline cognitive functioning  General Comments General comments (skin integrity, edema, etc.): assist for hygiene after BM; pt on 2L O2 at rest, SpO2 95% after amb on RA.  Coughing and mild SOB with mobility    Exercises     Assessment/Plan    PT Assessment Patient needs continued PT services  PT Problem List Decreased balance;Decreased knowledge of use of  DME;Decreased strength;Decreased mobility;Decreased activity tolerance          PT Treatment Interventions DME instruction;Therapeutic activities;Gait training;Therapeutic exercise;Patient/family education;Functional mobility training;Balance training;Stair training    PT Goals (Current goals can be found in the Care Plan section)  Acute Rehab PT Goals Patient Stated Goal: To go home PT Goal Formulation: With patient Time For Goal Achievement: 07/28/16 Potential to Achieve Goals: Good    Frequency Min 3X/week   Barriers to discharge        Co-evaluation               End of Session Equipment Utilized During Treatment: Gait belt Activity Tolerance: Patient tolerated treatment well Patient left: in chair;with call bell/phone within reach;with chair alarm set           Time: 1124-1150 PT Time Calculation (min) (ACUTE ONLY): 26 min   Charges:   PT Evaluation $PT Eval Moderate Complexity: 1 Procedure PT Treatments $Gait Training: 8-22 mins   PT G CodesReginia Naas 2016/08/09, 1:09 PM  Magda Kiel, [place] 2016/08/09

## 2016-07-22 DIAGNOSIS — E119 Type 2 diabetes mellitus without complications: Secondary | ICD-10-CM

## 2016-07-22 LAB — GLUCOSE, CAPILLARY
Glucose-Capillary: 205 mg/dL — ABNORMAL HIGH (ref 65–99)
Glucose-Capillary: 297 mg/dL — ABNORMAL HIGH (ref 65–99)
Glucose-Capillary: 316 mg/dL — ABNORMAL HIGH (ref 65–99)
Glucose-Capillary: 228 mg/dL — ABNORMAL HIGH (ref 65–99)

## 2016-07-22 MED ORDER — PREDNISONE 20 MG PO TABS: ORAL_TABLET | ORAL | 0 refills | Status: AC

## 2016-07-22 MED ORDER — PREDNISONE 20 MG PO TABS
40.0000 mg | ORAL_TABLET | Freq: Every day | ORAL | Status: DC
Start: 2016-07-23 — End: 2016-07-23
  Administered 2016-07-23: 40 mg via ORAL
  Filled 2016-07-22: qty 4

## 2016-07-22 MED ORDER — LEVOFLOXACIN 500 MG PO TABS: 500.0000 mg | ORAL_TABLET | Freq: Every day | ORAL | 0 refills | Status: AC

## 2016-07-22 NOTE — Progress Notes (Signed)
[facility] PROGRESS NOTE  Rick [name] ZGY:[id number] DOB: 05/14/1926 DOA: 07/19/2016  PCP: Mathews Argyle, MD  Brief History/Interval Summary: 83 year old Caucasian male with a past medical history of type 2 diabetes, pulmonary fibrosis, hypertension, history of biliary obstruction secondary to pancreatic lesion with stent, presented with one-week history of fever, cough, generalized weakness. Patient was suspected of having flulike illness. He was also noted to be hypokalemic. He was hospitalized for further management.  Reason for Visit: Flulike illness. Acute bronchitis.  Consultants: None  Procedures: Left lower extremity venous Doppler is negative for DVT  Antibiotics: Levaquin  Subjective/Interval History: Patient continues to feel better. Continues to cough, however, which is dry.    ROS: Denies any nausea or vomiting.  Objective:  Vital Signs  Vitals:   07/21/16 0520 07/21/16 1404 07/21/16 2129 07/22/16 0534  BP: 135/86 (!) 146/72 123/70 (!) 142/91  Pulse: 73 77 65 63  Resp: _0 Temp: 98.2 F (36.8 C) 98.4 F (36.9 C) 97.9 F (36.6 C) 97.4 F (36.3 C)  TempSrc: Oral Oral Oral Oral  SpO2: 100% 100% 100% 100%  Weight:      Height:        Intake/Output Summary (Last 24 hours) at 07/22/16 1008 Last data filed at 07/22/16 0918  Gross per 24 hour  Intake             1110 ml  Output             1200 ml  Net              -90 ml   Filed Weights   07/19/16 1602 07/20/16 1346  Weight: 54.4 kg (120 lb) 55.5 kg (122 lb 4.8 oz)    General appearance: alert, cooperative, appears stated age, less distracted and no distress Resp: Improved breath sound. Coarse breath sounds bilaterally. Continues to have crackles bilaterally. No wheezing. No rhonchi. Cardio: regular rate and rhythm, S1, S2 normal, no murmur, click, rub or gallop GI: soft, non-tender; bowel sounds normal; no masses,  no organomegaly Neurologic: Awake and alert.  Distracted. Mildly confused. Cranial nerves II-12 intact. Motor strength equal bilateral upper and lower extremities.  Lab Results:  Data Reviewed: I have personally reviewed following labs and imaging studies  CBC:  Recent Labs Lab 07/19/16 1610 07/21/16 0601  WBC 18.7* 12.1*  NEUTROABS 13.7*  --   HGB 12.2* 10.1*  HCT 34.8* 30.1*  MCV 87.7 88.5  PLT 307 591    Basic Metabolic Panel:  Recent Labs Lab 07/19/16 1610 07/20/16 0608 07/21/16 0601  NA 138 137 134*  K 2.8* 4.0 3.4*  CL 100* 102 102  CO2 _1 GLUCOSE 64* 106* 67  BUN _2 CREATININE 1.21 0.99 1.06  CALCIUM 8.3* 7.8* 7.6*  MG  --  1.7  --     GFR: Estimated Creatinine Clearance: 37.8 mL/min (by C-G formula based on SCr of 1.06 mg/dL).  Liver Function Tests:  Recent Labs Lab 07/19/16 1610 07/20/16 0608 07/21/16 0601  AST 85* 71* 52*  ALT 45 37 31  ALKPHOS 318* 260* 239*  BILITOT 2.1* 1.7* 1.4*  PROT 6.5 5.8* 5.0*  ALBUMIN 2.5* 1.9* 1.6*     Recent Labs Lab 07/19/16 1951  LIPASE 21   CBG:  Recent Labs Lab 07/21/16 0829 07/21/16 1140 07/21/16 1725 07/21/16 2126 07/22/16 0749  GLUCAP 68 134* 294* 361* 205*     Recent Results (from the past 240 hour(s))  Culture, blood (Routine X 2) w Reflex to ID Panel     Status: None (Preliminary result)   Collection Time: 07/19/16  7:41 PM  Result Value Ref Range Status   Specimen Description BLOOD LEFT ANTECUBITAL  Final   Special Requests BOTTLES DRAWN AEROBIC AND ANAEROBIC 5CC  Final   Culture NO GROWTH 2 DAYS  Final   Report Status PENDING  Incomplete  Culture, blood (Routine X 2) w Reflex to ID Panel     Status: None (Preliminary result)   Collection Time: 07/19/16  7:55 PM  Result Value Ref Range Status   Specimen Description BLOOD RIGHT ANTECUBITAL  Final   Special Requests BOTTLES DRAWN AEROBIC AND ANAEROBIC 5CC  Final   Culture NO GROWTH 2 DAYS  Final   Report Status PENDING  Incomplete  Culture, sputum-assessment      Status: None   Collection Time: 07/20/16  2:23 PM  Result Value Ref Range Status   Specimen Description EXPECTORATED SPUTUM  Final   Special Requests NONE  Final   Sputum evaluation THIS SPECIMEN IS ACCEPTABLE FOR SPUTUM CULTURE  Final   Report Status 07/21/2016 FINAL  Final  Culture, respiratory (NON-Expectorated)     Status: None (Preliminary result)   Collection Time: 07/20/16  2:23 PM  Result Value Ref Range Status   Specimen Description EXPECTORATED SPUTUM  Final   Special Requests NONE Reflexed from [id number]  Final   Gram Stain   Final    ABUNDANT WBC PRESENT,BOTH PMN AND MONONUCLEAR ABUNDANT GRAM NEGATIVE COCCI IN PAIRS RARE GRAM POSITIVE RODS    Culture CULTURE REINCUBATED FOR BETTER GROWTH  Final   Report Status PENDING  Incomplete      Radiology Studies: No results found.   Medications:  Scheduled: . aspirin EC  81 mg Oral Daily  . atenolol  50 mg Oral Daily  . cholecalciferol  1,000 Units Oral Daily  . enoxaparin (LOVENOX) injection  40 mg Subcutaneous Q24H  . feeding supplement (ENSURE ENLIVE)  237 mL Oral BID BM  . feeding supplement (PRO-STAT SUGAR FREE 64)  30 mL Oral BID  . insulin aspart  0-5 Units Subcutaneous QHS  . insulin aspart  0-9 Units Subcutaneous TID WC  . levofloxacin  500 mg Oral QHS  . multivitamin with minerals  1 tablet Oral Daily  . pantoprazole  40 mg Oral Daily  . [START ON 07/23/2016] predniSONE  40 mg Oral Q breakfast   Continuous:  [id number], albuterol  Assessment/Plan:  Principal Problem:   Influenza-like illness Active Problems:   Hypertension   PULMONARY FIBROSIS   GERD   Normocytic anemia   Chronic diastolic CHF (congestive heart failure) (HCC)   Diabetes mellitus type II, non insulin dependent (HCC)   Hypoglycemia   Hypokalemia   Abnormal LFTs (liver function tests)   Protein-calorie malnutrition, severe    Influenza-like illness/possible acute bronchitis Patient has been improving. Influenza PCR was  negative. Continue Levaquin for presumed acute bronchitis. Steroids also initiated due to interstitial disease. This will be tapered down over the next 10 days.  Interstitial lung disease As above. Currently on steroids, but does not take at home.  Hypokalemia  Serum potassium is 2.8 on admission. This was aggressively repleted.   Hypoglycemia in the setting of type II DM  Serum glucose 64 on admission, possibly contributing to pt's sxs; he was treated with 1 amp D-50% in ED. HbA1c is 5.7. Patient was noted to be on glimepiride at home, which has been held. Continue  to monitor CBGs. He should come off of the glimepiride. Blood glucose level elevated with 24 hours due to steroids. Steroid dose to be tapered down and this will result in improvement in the blood glucose levels.  Chronic diastolic CHF  Patient appeared to be dry at the time of initial evaluation. Patient was given gentle IV hydration. Seems euvolemic now. Last echocardiogram is from 2016 which showed a EF of 50-55% with mild LVH and grade 1 diastolic dysfunction and mild MR.  Essential Hypertension  Continue to monitor blood pressures.  GERD  Stable, asymptomatic. Hx of Barrett's esophagus, followed by GI. Continue PPI.  Abnormal LFT's/pancreatic lesion/CBD stent AST, alk phos, and bilirubin noted to be elevated. Apparently there is a history of biliary obstruction at level of pancreatic lesion, s/p CBD stent. There is concern for pancreatic cancer. This is being followed closely by his PCP. Patient is symptomatic. Abdomen is benign. Monitor LFTs periodically.  DVT Prophylaxis: Lovenox    Code Status: Full code  Family Communication: Discussed with the patient. Discussed with his wife and son as well.  Disposition Plan: Management as outlined above. Seen by physical therapy. Discussed with patient's wife today. She will not be able to provide supervision and assistance that the patient needs per physical therapy  evaluation. So, we will pursue short-term rehabilitation in a skilled nursing facility.     LOS: 3 days   [facility] Pager [phone] 07/22/2016, 10:08 AM  If 7PM-7AM, please contact night-coverage at [email or web address], password [email or web address]

## 2016-07-22 NOTE — NC FL2 (Signed)
[place] LEVEL OF CARE SCREENING TOOL     IDENTIFICATION  Patient Name: Rick [name] Birthdate: 10/30/1925 Sex: male Admission Date (Current Location): 07/19/2016  [facility] and [place] Number:  Herbalist and Address:  [facility] [place], [place] [address] [place], [place] [address]      Provider Number: [id number]  Attending Physician Name and Address:  Bonnielee Haff, MD  Relative Name and Phone Number:       Current Level of Care: Hospital Recommended Level of Care: [place] Prior Approval Number:    Date Approved/Denied:   PASRR Number: [id number]  Discharge Plan: SNF    Current Diagnoses: Patient Active Problem List   Diagnosis Date Noted  . Protein-calorie malnutrition, severe 07/21/2016  . Generalized weakness   . Normocytic anemia 07/19/2016  . Chronic diastolic CHF (congestive heart failure) ([place]) 07/19/2016  . Diabetes mellitus type II, non insulin dependent ([place]) 07/19/2016  . Hypoglycemia 07/19/2016  . Hypokalemia 07/19/2016  . Abnormal LFTs (liver function tests) 07/19/2016  . Influenza-like illness 07/19/2016  . Lung fibrosis ([employer]) 10/25/2015  . GERD 03/20/2010  . COUGH 12/01/2009  . Hypertension 11/08/2009  . PULMONARY FIBROSIS 11/08/2009    Orientation RESPIRATION BLADDER Height & Weight     Self, Time, Situation, Place  Normal External catheter, Incontinent Weight: 122 lb 4.8 oz (55.5 kg) Height:  5\' 7"  (170.2 cm)  BEHAVIORAL SYMPTOMS/MOOD NEUROLOGICAL BOWEL NUTRITION STATUS      Continent Diet (Regular / Thin Liquids)  AMBULATORY STATUS COMMUNICATION OF NEEDS Skin   Limited Assist (150 feet min assist) Verbally Normal                       Personal Care Assistance Level of Assistance  Bathing, Feeding, Dressing Bathing Assistance: Limited assistance Feeding assistance: Independent Dressing Assistance: Limited assistance     Functional Limitations Info  Sight,  Hearing, Speech Sight Info: Adequate Hearing Info: Adequate Speech Info: Adequate    SPECIAL CARE FACTORS FREQUENCY  PT (By licensed PT), OT (By licensed OT)     PT Frequency: 3x week OT Frequency: 3x week            Contractures Contractures Info: Not present    Additional Factors Info  Code Status, Allergies, Isolation Precautions, Insulin Sliding Scale Code Status Info: Full Code Allergies Info: Cephalexin, Codeine, Metformin And Related, Rofecoxib, Tramadol, Tussionex Pennkinetic Er Hydrocod Polst-cpm Polst Er, Erythromycin, Neomycin   Insulin Sliding Scale Info: Novolog daily with meals and bedtime Isolation Precautions Info: Isolation: Droplet precaution     Current Medications (07/22/2016):  This is the current hospital active medication list Current Facility-Administered Medications  Medication Dose Route Frequency Provider Last Rate Last Dose  . acetaminophen (TYLENOL) tablet 650 mg  650 mg Oral Daily PRN Ilene Qua Opyd, MD      . albuterol (PROVENTIL) (2.5 MG/3ML) 0.083% nebulizer solution 2.5 mg  2.5 mg Nebulization Q4H PRN Vianne Bulls, MD      . aspirin EC tablet 81 mg  81 mg Oral Daily Vianne Bulls, MD   81 mg at 07/22/16 0911  . atenolol (TENORMIN) tablet 50 mg  50 mg Oral Daily Vianne Bulls, MD   50 mg at 07/22/16 0911  . cholecalciferol (VITAMIN D) tablet 1,000 Units  1,000 Units Oral Daily Vianne Bulls, MD   1,000 Units at 07/22/16 0911  . enoxaparin (LOVENOX) injection 40 mg  40 mg Subcutaneous Q24H  Vianne Bulls, MD   40 mg at 07/22/16 1336  . feeding supplement (ENSURE ENLIVE) (ENSURE ENLIVE) liquid 237 mL  237 mL Oral BID BM Bonnielee Haff, MD      . feeding supplement (PRO-STAT SUGAR FREE 64) liquid 30 mL  30 mL Oral BID Bonnielee Haff, MD   30 mL at 07/22/16 0911  . insulin aspart (novoLOG) injection 0-5 Units  0-5 Units Subcutaneous QHS Alexis Hugelmeyer, DO   5 Units at 07/21/16 2348  . insulin aspart (novoLOG) injection 0-9 Units  0-9 Units  Subcutaneous TID WC Alexis Hugelmeyer, DO   5 Units at 07/22/16 1336  . levofloxacin (LEVAQUIN) tablet 500 mg  500 mg Oral QHS Bonnielee Haff, MD   500 mg at 07/21/16 2203  . multivitamin with minerals tablet 1 tablet  1 tablet Oral Daily Vianne Bulls, MD   1 tablet at 07/22/16 0911  . pantoprazole (PROTONIX) EC tablet 40 mg  40 mg Oral Daily Vianne Bulls, MD   40 mg at 07/22/16 0911  . [START ON 07/23/2016] predniSONE (DELTASONE) tablet 40 mg  40 mg Oral Q breakfast Bonnielee Haff, MD         Discharge Medications: Please see discharge summary for a list of discharge medications.  Relevant Imaging Results:  Relevant Lab Results:   Additional Information SSN [id number]   Barbette Or, [place]

## 2016-07-22 NOTE — Clinical Social Work Note (Signed)
Clinical Social Work Assessment  Patient Details  Name: Rick [name] MRN: [id number] Date of Birth: [birth year]  Date of referral:  07/22/16               Reason for consult:  Facility Placement                Permission sought to share information with:  Family Supports Permission granted to share information::  Yes, Verbal Permission Granted  Name::     Rick [name]  Relationship::  Spouse  Contact Information:  [phone]  Housing/Transportation Living arrangements for the past 2 months:  [place] of Information:  Patient, Spouse Patient Interpreter Needed:  None Criminal Activity/Legal Involvement Pertinent to Current Situation/Hospitalization:  No - Comment as needed Significant Relationships:  Spouse, Adult Children Lives with:  Spouse Do you feel safe going back to the place where you live?  Yes Need for family participation in patient care:  Yes (Comment)  Care giving concerns:  Spoke with patient wife over the phone, she does not express any concerns at this time regarding patient placement or return home if insurance were to deny.   Social Worker assessment / plan:  Holiday representative met with patient at bedside and spoke with patient spouse over the phone to offer support and discuss patient needs at discharge.  Patient currently lives at home with his wife, however she is unable to provide any physical support to patient upon return home.  Patient and patient wife are agreeable to ST-SNF placement in [place].  CSW to initiate referral and follow up with patient regarding available bed offers.  CSW did make patient and patient spouse aware of a possible insurance denial and the need to return home at discharge.  Patient and patient spouse verbalize understanding.  MD updated on current discharge disposition.  Employment status:  Retired Forensic scientist:  [employer]) PT Recommendations:  Home [place], [place] / Referral to community resources:  [place]  Patient/Family's Response to care:  Patient and patient spouse verbalize understanding of CSW role and appreciation for support and concern.  Patient and patient spouse aware of the possibility of patient return home at discharge pending insurance authorization.  Patient/Family's Understanding of and Emotional Response to Diagnosis, Current Treatment, and Prognosis:  Patient and spouse understanding of patient current limitations and potential barriers for return home.  Patient eager to return home but is agreeable with rehab to regain strength prior to return.  Emotional Assessment Appearance:  Appears stated age Attitude/Demeanor/Rapport:   (Cooperative and Engaged) Affect (typically observed):  Accepting, Appropriate, Calm, Hopeful Orientation:  Oriented to Self, Oriented to Situation, Oriented to Place, Oriented to  Time Alcohol / Substance use:  Not Applicable Psych involvement (Current and /or in the community):  No (Comment)  Discharge Needs  Concerns to be addressed:  Discharge Planning Concerns, Financial / Insurance Concerns Readmission within the last 30 days:  Yes Current discharge risk:  Physical Impairment, Lack of support system Barriers to Discharge:  Continued Medical Work up, Ship broker, Unsafe home situation  [place], [place]

## 2016-07-22 NOTE — Evaluation (Signed)
Occupational Therapy Evaluation Patient Details Name: Rick [name] MRN: [id number] DOB: [birth year] Today's Date: 07/22/2016    History of Present Illness Rick [name] is a 83 y.o. 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  Found to have influenza-like illness with hypokalemia.   Clinical Impression   PTA, pt was independent with ADL and functional mobility. His family does report that recently he has had difficulty standing from the toilet at home. Pt lives with wife who is unable to provide physical assistance to pt. Pt currently presents requiring min assist for all ADL due to decreased strength, impaired R shoulder AROM, and decreased activity tolerance. Additionally, pt requires increased time for processing and demonstrates emergent awareness. Due to functional decline and lack of assistance available at home, recommend short term SNF placement for continued rehabilitation in order to ensure maximum return to PLOF and independence with ADL. OT will continue to follow acutely.     Follow Up Recommendations  SNF;Supervision/Assistance - 24 hour    Equipment Recommendations  Other (comment) (TBD at next venue of care)    Recommendations for Other Services       Precautions / Restrictions Precautions Precautions: Fall Restrictions Weight Bearing Restrictions: No      Mobility Bed Mobility Overal bed mobility: Needs Assistance Bed Mobility: Supine to Sit;Sit to Supine     Supine to sit: Min assist Sit to supine: Min assist   General bed mobility comments: Min assist for lifting trunk when exiting bed and min assist for LE's on return to bed.  Transfers Overall transfer level: Needs assistance Equipment used: Rolling walker (2  wheeled) Transfers: Stand Pivot Transfers;Sit to/from Stand Sit to Stand: Min assist Stand pivot transfers: Min assist       General transfer comment: Min assist for power up to stand. Requires increased time and VC's for hand placement.    Balance Overall balance assessment: Needs assistance Sitting-balance support: No upper extremity supported;Feet supported Sitting balance-Leahy Scale: Fair Sitting balance - Comments: Slightly unsteady when leaning down to adjust socks requiring min guard assist.   Standing balance support: Bilateral upper extremity supported Standing balance-Leahy Scale: Fair Standing balance comment: Need UE support during dynamic standing activities.                             ADL Overall ADL's : Needs assistance/impaired     Grooming: Min guard;Standing   Upper Body Bathing: Sitting;Minimal assistance   Lower Body Bathing: Minimal assistance;Sit to/from stand   Upper Body Dressing : Sitting;Minimal assistance   Lower Body Dressing: Minimal assistance;Sit to/from stand   Toilet Transfer: Minimal assistance;Ambulation;RW;BSC Toilet Transfer Details (indicate cue type and reason): Simulated Toileting- Clothing Manipulation and Hygiene: Sit to/from stand;Min guard Toileting - Clothing Manipulation Details (indicate cue type and reason): Min assist for sit<>stand     Functional mobility during ADLs: Minimal assistance;Rolling walker General ADL Comments: Pt and son report that he is incontinent of urine and stool. Pt with decreased activity tolerance for ADL and fatigues easily. Coughing noted at end of session and pt encouraged to have a seat. SpO2 91-96% and HR 67.     Vision Vision Assessment?: No apparent visual deficits   Perception     Praxis      Pertinent  Vitals/Pain Pain Assessment: No/denies pain     Hand Dominance Right   Extremity/Trunk Assessment Upper Extremity Assessment Upper Extremity Assessment: Generalized  weakness;RUE deficits/detail RUE Deficits / Details: Decreased AROM to approximately 115 degrees due to rotator cuff injury.   Lower Extremity Assessment Lower Extremity Assessment: Generalized weakness       Communication Communication Communication: HOH (has B hearing aides)   Cognition Arousal/Alertness: Awake/alert Behavior During Therapy: WFL for tasks assessed/performed Overall Cognitive Status: Impaired/Different from baseline Area of Impairment: Attention;Awareness;Problem solving   Current Attention Level: Sustained       Awareness: Emergent Problem Solving: Slow processing General Comments: Pt requiring increased time for processing and demonstrates emergent awareness during ADL session.   General Comments       Exercises       Shoulder Instructions      Home Living Family/patient expects to be discharged to:: Private residence Living Arrangements: Spouse/significant other Available Help at Discharge: Family Type of Home: House Home Access: Stairs to enter Technical brewer of Steps: 3-4 Entrance Stairs-Rails: Right Home Layout: One level     Bathroom Shower/Tub: Tub/shower unit Shower/tub characteristics: Curtain Biochemist, clinical: Standard     Home Equipment: Environmental consultant - 2 wheels          Prior Functioning/Environment Level of Independence: Independent        Comments: reports walked unaided PTA; did struggle to stand from commode at home.        OT Problem List: Decreased strength;Decreased range of motion;Decreased activity tolerance;Impaired balance (sitting and/or standing);Decreased cognition;Decreased knowledge of use of DME or AE;Decreased knowledge of precautions;Impaired UE functional use;Pain   OT Treatment/Interventions: Self-care/ADL training;Therapeutic exercise;Energy conservation;DME and/or AE instruction;Therapeutic activities;Patient/family education;Balance training    OT Goals(Current goals can be found in the care plan  section) Acute Rehab OT Goals Patient Stated Goal: To be able to take care of himself again. OT Goal Formulation: With patient/family Time For Goal Achievement: 07/29/16 Potential to Achieve Goals: Good ADL Goals Pt Will Perform Upper Body Dressing: with modified independence;sitting Pt Will Perform Lower Body Dressing: with modified independence;sit to/from stand Pt Will Transfer to Toilet: with modified independence;ambulating;bedside commode (BSC over toilet) Pt Will Perform Toileting - Clothing Manipulation and hygiene: with modified independence;sit to/from stand Pt Will Perform Tub/Shower Transfer: with modified independence;Tub transfer;3 in 1;rolling walker;ambulating Pt/caregiver will Perform Home Exercise Program: Increased strength;Both right and left upper extremity;With written HEP provided Additional ADL Goal #1: Pt will demonstrate improved activity tolerance for ADL by standing at sink for 15 minutes for grooming tasks with no therapeutic rest breaks.  OT Frequency: Min 2X/week   Barriers to D/C:            Co-evaluation              End of Session Equipment Utilized During Treatment: Gait belt;Rolling walker Nurse Communication: Mobility status  Activity Tolerance: Patient tolerated treatment well Patient left: in bed;with bed alarm set;with call bell/phone within reach;with family/visitor present   Time: [id number] OT Time Calculation (min): 24 min Charges:  OT General Charges $OT Visit: 1 Procedure OT Evaluation $OT Eval Moderate Complexity: 1 Procedure OT Treatments $Self Care/Home Management : 8-22 mins G-Codes:    Norman Herrlich, OTR/L [id number] 07/22/2016, 4:54 PM

## 2016-07-22 NOTE — Clinical Social Work Placement (Signed)
   CLINICAL SOCIAL WORK PLACEMENT  NOTE  Date:  07/22/2016  Patient Details  Name: MOUSSA KOWALCHUK MRN: [id number] Date of Birth: [birth year]  Clinical Social Work is seeking post-discharge placement for this patient at the [place] level of care (*CSW will initial, date and re-position this form in  chart as items are completed):  Yes   Patient/family provided with [facility] Work Department's list of facilities offering this level of care within the geographic area requested by the patient (or if unable, by the patient's family).  Yes   Patient/family informed of their freedom to choose among providers that offer the needed level of care, that participate in Medicare, Medicaid or managed care program needed by the patient, have an available bed and are willing to accept the patient.  Yes   Patient/family informed of [facility]'s ownership interest in [facility] and [facility], as well as of the fact that they are under no obligation to receive care at these facilities.  PASRR submitted to EDS on 07/22/16     PASRR number received on 07/22/16     Existing PASRR number confirmed on       FL2 transmitted to all facilities in geographic area requested by pt/family on 07/22/16     FL2 transmitted to all facilities within larger geographic area on       Patient informed that his/her managed care company has contracts with or will negotiate with certain facilities, including the following:            Patient/family informed of bed offers received.  Patient chooses bed at       Physician recommends and patient chooses bed at      Patient to be transferred to   on  .  Patient to be transferred to facility by       Patient family notified on   of transfer.  Name of family member notified:        PHYSICIAN Please sign FL2, Please prepare priority discharge summary, including medications     Additional Comment:    Barbette Or,  [place]

## 2016-07-23 DIAGNOSIS — J209 Acute bronchitis, unspecified: Principal | ICD-10-CM

## 2016-07-23 LAB — GLUCOSE, CAPILLARY
GLUCOSE-CAPILLARY: 193 mg/dL — AB (ref 65–99)
GLUCOSE-CAPILLARY: 331 mg/dL — AB (ref 65–99)

## 2016-07-23 LAB — CULTURE, RESPIRATORY W GRAM STAIN

## 2016-07-23 LAB — BASIC METABOLIC PANEL
ANION GAP: 8 (ref 5–15)
BUN: 20 mg/dL (ref 6–20)
CALCIUM: 8.4 mg/dL — AB (ref 8.9–10.3)
CO2: 26 mmol/L (ref 22–32)
Chloride: 101 mmol/L (ref 101–111)
Creatinine, Ser: 1.03 mg/dL (ref 0.61–1.24)
GFR calc non Af Amer: >60 mL/min (ref >60)
Glucose, Bld: 264 mg/dL — ABNORMAL HIGH (ref 65–99)
Potassium: 4.1 mmol/L (ref 3.5–5.1)
SODIUM: 135 mmol/L (ref 135–145)

## 2016-07-23 LAB — CULTURE, RESPIRATORY

## 2016-07-23 NOTE — Discharge Summary (Signed)
[facility]  Physician Discharge Summary   Patient ID: Rick [name] MRN: [id number] DOB/AGE: 1925-08-10 83 y.o.  Admit date: 07/19/2016 Discharge date: 07/23/2016  PCP: Mathews Argyle, MD  DISCHARGE DIAGNOSES:  Principal Problem:   Influenza-like illness Active Problems:   Hypertension   PULMONARY FIBROSIS   GERD   Normocytic anemia   Chronic diastolic CHF (congestive heart failure) (HCC)   Diabetes mellitus type II, non insulin dependent (HCC)   Hypoglycemia   Hypokalemia   Abnormal LFTs (liver function tests)   Protein-calorie malnutrition, severe   RECOMMENDATIONS FOR OUTPATIENT FOLLOW UP: 1. CBC and basic metabolic panel in 1 week 2. Monitor CBGs 3 times a day. May use sliding scale if needed, but would not use oral agents or long-acting insulin as discussed below.   DISCHARGE CONDITION: fair  Diet recommendation: Modified carbohydrate  Filed Weights   07/19/16 1602 07/20/16 1346  Weight: 54.4 kg (120 lb) 55.5 kg (122 lb 4.8 oz)    INITIAL HISTORY: 83 year old Caucasian male with a past medical history of type 2 diabetes, pulmonary fibrosis, hypertension, history of biliary obstruction secondary to pancreatic lesion with stent, presented with one-week history of fever, cough, generalized weakness. Patient was suspected of having flulike illness. He was also noted to be hypokalemic. He was hospitalized for further management.  Consultations:  None  Procedures: Lower extremity venous Doppler was negative for DVT  HOSPITAL COURSE:   Influenza-like illness/possible acute bronchitis Patient was admitted to the hospital. Influenza PCR was negative. Patient was thought to have influenza-like illness or acute bronchitis. Patient was started on antibiotics and steroids. He also has a history of interstitial lung disease/pulmonary fibrosis. Patient started feeling better. Cough persists but he is afebrile.   Interstitial lung disease As above.  Currently on steroids, but does not take at home. Quick taper.  Hypokalemia  Serum potassium is 2.8 on admission. This was aggressively repleted.   Hypoglycemia in the setting of type II DM  Serum glucose 64 on admission, possibly contributing to pt's sxs; he was treated with 1 amp D-50% in ED. HbA1c is 5.7. Patient was noted to be on glimepiride at home, which has been held. CBGs here have been elevated due to steroids. As steroid is tapered off, CBG should normalize. Would recommend not treating him with any oral agents for now. He is an elderly gentleman and does not need aggressive control. Would recommend continuing to monitor CBG. May utilize sliding scale if needed. But, I anticipate that he will not require any blood glucose lowering agents once steroids have been tapered off.   Chronic diastolic CHF  Patient appeared to be dry at the time of initial evaluation. Patient was given gentle IV hydration. Last echocardiogram is from 2016 which showed a EF of 50-55% with mild LVH and grade 1 diastolic dysfunction and mild MR. Now euvolemic.  Essential Hypertension  Continue home medications.  GERD  Stable, asymptomatic. Hx of Barrett's esophagus, followed by GI. Continue PPI.  Abnormal LFT's/pancreatic lesion/CBD stent AST, alk phos, and bilirubin noted to be elevated. Apparently there is a history of biliary obstruction at level of pancreatic lesion, s/p CBD stent by Dr. Watt Climes. There is concern for pancreatic cancer. This is being followed closely by his PCP. Patient is symptomatic. Abdomen is benign. Monitor LFTs periodically.  Overall, stable. Much improved. Seen by physical therapy. SNF was recommended as the patient's wife will be unable to provide the assistance that he needs currently. He will benefit from short-term rehabilitation. Okay  for discharge today.  PERTINENT LABS:  The results of significant diagnostics from this hospitalization (including imaging, microbiology,  ancillary and laboratory) are listed below for reference.    Microbiology: Recent Results (from the past 240 hour(s))  Culture, blood (Routine X 2) w Reflex to ID Panel     Status: None (Preliminary result)   Collection Time: 07/19/16  7:41 PM  Result Value Ref Range Status   Specimen Description BLOOD LEFT ANTECUBITAL  Final   Special Requests BOTTLES DRAWN AEROBIC AND ANAEROBIC 5CC  Final   Culture NO GROWTH 3 DAYS  Final   Report Status PENDING  Incomplete  Culture, blood (Routine X 2) w Reflex to ID Panel     Status: None (Preliminary result)   Collection Time: 07/19/16  7:55 PM  Result Value Ref Range Status   Specimen Description BLOOD RIGHT ANTECUBITAL  Final   Special Requests BOTTLES DRAWN AEROBIC AND ANAEROBIC 5CC  Final   Culture NO GROWTH 3 DAYS  Final   Report Status PENDING  Incomplete  Culture, sputum-assessment     Status: None   Collection Time: 07/20/16  2:23 PM  Result Value Ref Range Status   Specimen Description EXPECTORATED SPUTUM  Final   Special Requests NONE  Final   Sputum evaluation THIS SPECIMEN IS ACCEPTABLE FOR SPUTUM CULTURE  Final   Report Status 07/21/2016 FINAL  Final  Culture, respiratory (NON-Expectorated)     Status: None (Preliminary result)   Collection Time: 07/20/16  2:23 PM  Result Value Ref Range Status   Specimen Description EXPECTORATED SPUTUM  Final   Special Requests NONE Reflexed from [id number]  Final   Gram Stain   Final    ABUNDANT WBC PRESENT,BOTH PMN AND MONONUCLEAR ABUNDANT GRAM NEGATIVE COCCI IN PAIRS RARE GRAM POSITIVE RODS    Culture CULTURE REINCUBATED FOR BETTER GROWTH  Final   Report Status PENDING  Incomplete     Labs: Basic Metabolic Panel:  Recent Labs Lab 07/19/16 1610 07/20/16 0608 07/21/16 0601 07/23/16 0600  NA 138 137 134* 135  K 2.8* 4.0 3.4* 4.1  CL 100* 102 102 101  CO2 _0 GLUCOSE 64* 106* 67 264*  BUN _1 CREATININE 1.21 0.99 1.06 1.03  CALCIUM 8.3* 7.8* 7.6* 8.4*  MG  --   1.7  --   --    Liver Function Tests:  Recent Labs Lab 07/19/16 1610 07/20/16 0608 07/21/16 0601  AST 85* 71* 52*  ALT 45 37 31  ALKPHOS 318* 260* 239*  BILITOT 2.1* 1.7* 1.4*  PROT 6.5 5.8* 5.0*  ALBUMIN 2.5* 1.9* 1.6*    Recent Labs Lab 07/19/16 1951  LIPASE 21   CBC:  Recent Labs Lab 07/19/16 1610 07/21/16 0601  WBC 18.7* 12.1*  NEUTROABS 13.7*  --   HGB 12.2* 10.1*  HCT 34.8* 30.1*  MCV 87.7 88.5  PLT 307 200   BNP: BNP (last 3 results)  Recent Labs  07/19/16 1952  BNP 615.7*    CBG:  Recent Labs Lab 07/22/16 0749 07/22/16 1225 07/22/16 1725 07/22/16 2057 07/23/16 0835  GLUCAP 205* 297* 316* 228* 193*     IMAGING STUDIES Dg Chest 2 View  Result Date: 07/19/2016 CLINICAL DATA:  Cough, fever and weakness. EXAM: CHEST  2 VIEW COMPARISON:  07/19/2016. FINDINGS: Normal sized heart. Stable prominence of the interstitial markings without superimposed airspace opacity. Diffuse osteopenia. Bony remodeling of the right shoulder compatible with a large, chronic rotator cuff tear. Aortic  calcifications and tortuosity. IMPRESSION: Stable chronic interstitial lung disease.  No acute abnormality. Electronically Signed   By: Claudie Revering M.D.   On: 07/19/2016 20:49   Dg Chest 2 View  Result Date: 07/19/2016 CLINICAL DATA:  Cough, chest congestion, fever EXAM: CHEST  2 VIEW COMPARISON:  CT chest of 09/15/2015 and chest x-ray of 07/26/2014 FINDINGS: Coarse prominent interstitial lung markings are noted diffusely primarily at the lung bases left-greater-than-right. Correlate with the prior CT chest, these findings are most consistent with pulmonary fibrosis. Superimposed pneumonia particular at the left lung base cannot be excluded. No effusion is seen. Mediastinal and hilar contours are unremarkable. The heart is within upper limits of normal. No bony abnormality is seen. Common bile duct stent is noted. IMPRESSION: 1. Coarse prominent markings primarily at the lung  bases left-greater-than-right most consistent with pulmonary fibrosis. Difficult to exclude pneumonia particular at the left lung base. 2. Stable mild cardiomegaly. 3. Common bile duct stent. Electronically Signed   By: Ivar Drape M.D.   On: 07/19/2016 14:35    DISCHARGE EXAMINATION: Vitals:   07/22/16 1439 07/22/16 2214 07/23/16 0456 07/23/16 1010  BP: 137/61 132/76 136/76 117/72  Pulse: 62 61 (!) 58 67  Resp: _0 Temp: 97.5 F (36.4 C) 97.7 F (36.5 C) 97.5 F (36.4 C)   TempSrc: Oral Oral Oral   SpO2: 100% 98% 96%   Weight:      Height:       General appearance: alert, cooperative, appears stated age and no distress Resp: Intake is to have crackles bilaterally which is due to his pulmonary fibrosis. No wheezing. No rhonchi. Cardio: regular rate and rhythm, S1, S2 normal, no murmur, click, rub or gallop GI: soft, non-tender; bowel sounds normal; no masses,  no organomegaly Extremities: extremities normal, atraumatic, no cyanosis or edema  DISPOSITION: SNF  Discharge Instructions    Call MD for:  extreme fatigue    Complete by:  As directed    Call MD for:  persistant dizziness or light-headedness    Complete by:  As directed    Call MD for:  persistant nausea and vomiting    Complete by:  As directed    Call MD for:  severe uncontrolled pain    Complete by:  As directed    Call MD for:  temperature >100.4    Complete by:  As directed    Diet Carb Modified    Complete by:  As directed    Discharge instructions    Complete by:  As directed    See instructions on the discharge summary.  You were cared for by a hospitalist during your hospital stay. If you have any questions about your discharge medications or the care you received while you were in the hospital after you are discharged, you can call the unit and asked to speak with the hospitalist on call if the hospitalist that took care of you is not available. Once you are discharged, your primary care physician  will handle any further medical issues. Please note that NO REFILLS for any discharge medications will be authorized once you are discharged, as it is imperative that you return to your primary care physician (or establish a relationship with a primary care physician if you do not have one) for your aftercare needs so that they can reassess your need for medications and monitor your lab values. If you do not have a primary care physician, you can call [phone] for a  physician referral.   Increase activity slowly    Complete by:  As directed       ALLERGIES:  Allergies  Allergen Reactions  . Cephalexin Itching  . Codeine Itching  . Metformin And Related     Weight loss  . Rofecoxib     unknown  . Tramadol Itching  . Tussionex Pennkinetic Er [Hydrocod Polst-Cpm Polst Er] Itching  . Erythromycin Rash  . Neomycin Rash     Current Discharge Medication List    START taking these medications   Details  levofloxacin (LEVAQUIN) 500 MG tablet Take 1 tablet (500 mg total) by mouth at bedtime. Qty: 4 tablet, Refills: 0    predniSONE (DELTASONE) 20 MG tablet Take 2 tablets once daily for 5 days, then take 1 tablet once a a day for 5 days, then STOP. Qty: 15 tablet, Refills: 0      CONTINUE these medications which have NOT CHANGED   Details  acetaminophen (TYLENOL) 325 MG tablet Take 325 mg by mouth daily as needed for moderate pain or headache.     aspirin EC 81 MG tablet Take 1 tablet (81 mg total) by mouth daily. Qty: 30 tablet, Refills: 0    atenolol (TENORMIN) 50 MG tablet Take 50 mg by mouth daily. Refills: 1    Cholecalciferol (VITAMIN D3) 1000 units CAPS Take 1,000 Units by mouth daily.  Qty: 60 capsule    Chromium 1000 MCG TABS Take 2,000 mcg by mouth daily.  Qty: 60 tablet, Refills: 0    Cinnamon 500 MG capsule Take 1,000 mg by mouth daily.  Qty: 60 capsule, Refills: 0    Flaxseed, Linseed, (FLAXSEED OIL) 1000 MG CAPS Take 1,000 mg by mouth daily.  Qty: 60 capsule,  Refills: 0    Multiple Vitamin (MULTIVITAMIN WITH MINERALS) TABS tablet Take 1 tablet by mouth daily.    Omega-3 Fatty Acids (FISH OIL) 600 MG CAPS Take 600 mg by mouth daily.  Qty: 30 capsule, Refills: 0    Omeprazole 20 MG TBEC Take 20 mg by mouth daily.  Qty: 30 each, Refills: 0      STOP taking these medications     glimepiride (AMARYL) 2 MG tablet      glimepiride (AMARYL) 4 MG tablet          Follow-up Information    Mathews Argyle, MD. Schedule an appointment as soon as possible for a visit in 2 week(s).   Specialty:  Internal Medicine Contact information: [address] [place]  [address] [phone]           TOTAL DISCHARGE TIME: 35 mins  [facility]  [facility] Pager [phone]  07/23/2016, 11:32 AM

## 2016-07-23 NOTE — Discharge Instructions (Signed)
Pulmonary Fibrosis Pulmonary fibrosis is a type of lung disease that causes scarring. Over time, the scar tissue (fibrosis) builds up in the air sacs of your lungs. This makes it hard for you to breathe. Less oxygen can get into your blood. Scarring from pulmonary fibrosis gets worse over time. This damage is permanent. Having damaged lungs may make it more likely that you will have heart problems as well. What are the causes? Usually, the cause of pulmonary fibrosis is not known (idiopathic pulmonary fibrosis). However, pulmonary fibrosis can be caused by:  Exposure to occupational and environmental toxins. These include asbestos, silica, and metal dusts.  Inhaling moldy hay. This can cause an allergic reaction in the lung (farmer's lung) that can lead to pulmonary fibrosis.  Inhaling toxic fumes.  Certain medicines. These include drugs used in radiation therapy or used to treat seizures, heart problems, and some infections.  Autoimmune diseases, such as rheumatoid arthritis, systemic sclerosis, and connective tissue diseases.  Sarcoidosis. In this disease, areas of inflammatory cells (granulomas) form and most often affect the lungs.  Genes. Some cases of pulmonary fibrosis may be passed down through families. What increases the risk? You may be at a higher risk for developing pulmonary fibrosis if:  You have a family history of the disease.  You have an autoimmune disease or another condition linked to pulmonary fibrosis.  You are exposed to certain substances or fumes found in agricultural, farm, Architect, or factory work.  You take certain medicines. What are the signs or symptoms? Symptoms may include:  Difficulty breathing that gets worse with activity.  Dry, hacking cough.  Rapid, shallow breathing during exercise or while at rest.  Shortness of breath that gets worse (dyspnea).  Bluish skin and lips.  Loss of appetite.  Loss of strength.  Weight loss and  fatigue.  Rounded and enlarged fingertips (clubbing). How is this diagnosed? Your health care provider may suspect pulmonary fibrosis based on your symptoms and medical history. Diagnosis may include a physical exam. Your health care provider will check for signs that strongly suggest that you have pulmonary fibrosis, such as:  Blue skin around your fingernails or mouth from reduced oxygen.  Clubbing around the ends of your fingers.  A crackling sound when you breathe. Your health care provider may also do tests to confirm the diagnosis. These may include:  Looking inside your lungs with an instrument (bronchoscopy).  Imaging studies of your lungs and heart using:  X-rays.  CT scan.  Sound waves (echocardiogram).  Tests to measure how well you are breathing (pulmonary function tests).  Exercise testing to see how well your lungs work while you are walking.  Blood tests.  A procedure to remove a small piece of lung tissue to examine in a lab (biopsy). How is this treated? There is no cure for pulmonary fibrosis. Treatments focus on managing symptoms and preventing scarring from getting worse. This can include:  Medicines.  You may take steroids to prevent permanent lung changes. Your health care provider may put you on a high dose at first, then on lower dosages for the long term.  Medicines to suppress your bodys defense (immune) system. These can have serious side effects.  You may be monitored with X-rays and laboratory work.  Oxygen therapy may be helpful if oxygen in your blood is low.  Surgery. In some cases, a lung transplant is an option. Follow these instructions at home:  Take medicines only as directed by your health care provider.  Keep your vaccinations up to date as recommended by your health care provider.  Do not use any tobacco products, including cigarettes, chewing tobacco, or electronic cigarettes. If you need help quitting, ask your health care  provider.  Get regular exercise, but do not overexert yourself. Ask your health care provider to suggest some activities that are safe for you to do. Walking and chair exercises can help if you have physical limitations.  Consider joining a pulmonary rehabilitation program or a support group for people with pulmonary fibrosis.  Eat small meals often so you do not get too full. Overeating can make breathing trouble worse.  Maintain a healthy weight. Lose weight if you need to.  Do breathing exercises as directed by your health care provider.  Keep all follow-up visits as directed by your health care provider. This is important. Contact a health care provider if:  You are not able to be as active as usual.  You have a long-lasting (chronic) cough.  You are often short of breath.  You have a fever or chills. Get help right away if:  You have chest pain.  Your breathing is much worse.  You cannot take a deep breath.  You have blue skin around your mouth or fingers.  You have clubbing of your fingers.  You cough up mucus that is dark in color.  You have a lot of headaches.  You get very confused or sleepy. This information is not intended to replace advice given to you by your health care provider. Make sure you discuss any questions you have with your health care provider. Document Released: 09/01/2003 Document Revised: 11/17/2015 Document Reviewed: 11/19/2013 Elsevier Interactive Patient Education  2017 [employer]  Acute Bronchitis, Adult Acute bronchitis is when air tubes (bronchi) in the lungs suddenly get swollen. The condition can make it hard to breathe. It can also cause these symptoms:  A cough.  Coughing up clear, yellow, or green mucus.  Wheezing.  Chest congestion.  Shortness of breath.  A fever.  Body aches.  Chills.  A sore throat. Follow these instructions at home: Medicines  Take over-the-counter and prescription medicines only as told  by your doctor.  If you were prescribed an antibiotic medicine, take it as told by your doctor. Do not stop taking the antibiotic even if you start to feel better. General instructions  Rest.  Drink enough fluids to keep your pee (urine) clear or pale yellow.  Avoid smoking and secondhand smoke. If you smoke and you need help quitting, ask your doctor. Quitting will help your lungs heal faster.  Use an inhaler, cool mist vaporizer, or humidifier as told by your doctor.  Keep all follow-up visits as told by your doctor. This is important. How is this prevented? To lower your risk of getting this condition again:  Wash your hands often with soap and water. If you cannot use soap and water, use hand sanitizer.  Avoid contact with people who have cold symptoms.  Try not to touch your hands to your mouth, nose, or eyes.  Make sure to get the flu shot every year. Contact a doctor if:  Your symptoms do not get better in 2 weeks. Get help right away if:  You cough up blood.  You have chest pain.  You have very bad shortness of breath.  You become dehydrated.  You faint (pass out) or keep feeling like you are going to pass out.  You keep throwing up (vomiting).  You have  a very bad headache.  Your fever or chills gets worse. This information is not intended to replace advice given to you by your health care provider. Make sure you discuss any questions you have with your health care provider. Document Released: 11/28/2007 Document Revised: 01/18/2016 Document Reviewed: 11/30/2015 Elsevier Interactive Patient Education  2017 [employer].

## 2016-07-23 NOTE — Care Management Note (Signed)
Case Management Note  Patient Details  Name: Rick [name] MRN: [id number] Date of Birth: [birth year]  Subjective/Objective:   Admitted with influenza-like illness with hypokalemia,  diabetes mellitus, pulmonary fibrosis, hypertension, and biliary obstruction secondary to pancreatic lesion with stent.              Action/Plan: Plan is to d/c to SNF today.CSW managing disposition to SNF. CM to f/u as needs presents.  Expected Discharge Date:                  Expected Discharge Plan:  [place]  In-House Referral:  Clinical Social Work  Discharge planning Services  CM Consult  Post Acute Status of Service:  Completed, signed off  If discussed at Long Length of Stay Meetings, dates discussed:    Additional Comments:  Sharin Mons, RN 07/23/2016, 11:26 AM

## 2016-07-23 NOTE — Care Management Important Message (Signed)
Important Message  Patient Details  Name: Rick [name] MRN: [id number] Date of Birth: 11/06/1925   Medicare Important Message Given:  Yes    Nathen May 07/23/2016, 2:12 PM

## 2016-07-23 NOTE — Progress Notes (Signed)
Physical Therapy Treatment Patient Details Name: Rick [name] MRN: [id number] DOB: [birth year] Today's Date: 07/23/2016    History of Present Illness Rick [name] is a 83 y.o. 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  Found to have influenza-like illness with hypokalemia.    PT Comments    Pt requiring MIN A for bed mobility and cueing for safety. Wife will not be able to assist pt at current level of care.  Recommend SNF at this time.  Follow Up Recommendations  SNF;Supervision/Assistance - 24 hour     Equipment Recommendations  None recommended by PT    Recommendations for Other Services       Precautions / Restrictions Precautions Precautions: Fall Restrictions Weight Bearing Restrictions: No    Mobility  Bed Mobility Overal bed mobility: Needs Assistance Bed Mobility: Supine to Sit;Sit to Supine     Supine to sit: Min assist Sit to supine: Min guard   General bed mobility comments: MIN A for trunk with supine > sit.  Sit > supine with min/guard and pt able to scoot self up with bed in Trendelenberg  Transfers Overall transfer level: Needs assistance Equipment used: Rolling walker (2 wheeled) Transfers: Sit to/from [facility] Sit to Stand: Min assist Stand pivot transfers: Min assist       General transfer comment: Needed cues for hand placement with stand from bed and BSC.  Pt refused to sit in recliner stating it was too hard.  Encouraged up right position of bed.  Ambulation/Gait Ambulation/Gait assistance: Min guard;Min assist Ambulation Distance (Feet): 150 Feet Assistive device: Rolling walker (2 wheeled) Gait Pattern/deviations: Step-through pattern;Trunk flexed;Decreased stride length Gait  velocity: decreased   General Gait Details: cues for posture and positioning within RW. Difficulty with turning   Stairs            Wheelchair Mobility    Modified Rankin (Stroke Patients Only)       Balance Overall balance assessment: Needs assistance         Standing balance support: Bilateral upper extremity supported Standing balance-Leahy Scale: Poor                      Cognition Arousal/Alertness: Awake/alert Behavior During Therapy: WFL for tasks assessed/performed Overall Cognitive Status: No family/caregiver present to determine baseline cognitive functioning       Memory: Decreased short-term memory     Awareness: Emergent Problem Solving: Slow processing General Comments: needed repetitive cueing and increased time    Exercises      General Comments        Pertinent Vitals/Pain Pain Assessment: No/denies pain    Home Living                      Prior Function            PT Goals (current goals can now be found in the care plan section) Acute Rehab PT Goals Patient Stated Goal: To be able to take care of himself again. PT Goal Formulation: With patient Time For Goal Achievement: 07/28/16 Potential to Achieve Goals: Good Progress towards PT goals: Progressing toward goals    Frequency    Min 3X/week      PT Plan Discharge plan needs to be updated    Co-evaluation  End of Session Equipment Utilized During Treatment: Gait belt Activity Tolerance: Patient tolerated treatment well Patient left: in bed;with call bell/phone within reach;with bed alarm set     Time: [id number] PT Time Calculation (min) (ACUTE ONLY): 20 min  Charges:  $Gait Training: 8-22 mins                    G Codes:      Nikoletta Varma LUBECK 07/23/2016, 9:56 AM

## 2016-07-23 NOTE — Progress Notes (Signed)
CSW following for SNF admission- gave bed offers to pt- prefers [facility]  CSW confirmed [facility] has a bed  [employer] auth started for today  Jorge Ny, [place] Social Worker [phone]

## 2016-07-23 NOTE — Progress Notes (Addendum)
Patient will discharge to [facility]  Anticipated discharge date:1/29 Family notified: Maretta Los (spouse) Transportation by [facility]- called at 1pm Report#: [id number]  [place] signing off.  Jorge Ny, LCSW Clinical Social Worker [phone]

## 2016-07-23 NOTE — Progress Notes (Signed)
[facility] to be D/C'd Nursing Home per MD order.  Discussed with the patient and all questions fully answered. Report called to facility to [facility]   VSS, Skin clean, dry and intact without evidence of skin break down, no evidence of skin tears noted. IV catheter discontinued intact. Site without signs and symptoms of complications. Dressing and pressure applied.  An After Visit Summary was printed and given to the patient. Patient received prescription.  D/c education completed with patient/family including follow up instructions, medication list, d/c activities limitations if indicated, with other d/c instructions as indicated by MD - patient able to verbalize understanding, all questions fully answered.   Patient instructed to return to ED, call 911, or call MD for any changes in condition.   Patient escorted via [place], and D/C home via private auto.  Betha Loa Bridgett Hattabaugh 07/23/2016 1:40 PM

## 2016-07-23 NOTE — Clinical Social Work Placement (Signed)
   CLINICAL SOCIAL WORK PLACEMENT  NOTE  Date:  07/23/2016  Patient Details  Name: Rick [name] MRN: [id number] Date of Birth: 02-Apr-1926  Clinical Social Work is seeking post-discharge placement for this patient at the [place] level of care (*CSW will initial, date and re-position this form in  chart as items are completed):  Yes   Patient/family provided with [place] Work Department's list of facilities offering this level of care within the geographic area requested by the patient (or if unable, by the patient's family).  Yes   Patient/family informed of their freedom to choose among providers that offer the needed level of care, that participate in Medicare, Medicaid or managed care program needed by the patient, have an available bed and are willing to accept the patient.  Yes   Patient/family informed of [facility]'s ownership interest in [facility] and [facility], as well as of the fact that they are under no obligation to receive care at these facilities.  PASRR submitted to EDS on 07/22/16     PASRR number received on 07/22/16     Existing PASRR number confirmed on       FL2 transmitted to all facilities in geographic area requested by pt/family on 07/22/16     FL2 transmitted to all facilities within larger geographic area on       Patient informed that his/her managed care company has contracts with or will negotiate with certain facilities, including the following:        Yes   Patient/family informed of bed offers received.  Patient chooses bed at [facility]     Physician recommends and patient chooses bed at      Patient to be transferred to [facility] on 07/23/16.  Patient to be transferred to facility by ptar     Patient family notified on 07/23/16 of transfer.  Name of family member notified:  Maretta Los     PHYSICIAN Please sign FL2, Please prepare priority discharge summary, including medications      Additional Comment:    _______________________________________________ Jorge Ny, LCSW 07/23/2016, 12:58 PM

## 2016-07-24 LAB — CULTURE, BLOOD (ROUTINE X 2)
Culture: NO GROWTH
Culture: NO GROWTH

## 2016-08-23 DEATH — deceased
